# Patient Record
Sex: Female | Born: 1995 | Race: Black or African American | Hispanic: No | Marital: Single | Smoking: Never smoker
Health system: Southern US, Community
[De-identification: ages and names within clinical notes are randomized; demographics above are authoritative.]

## PROBLEM LIST (undated history)

## (undated) DIAGNOSIS — Z789 Other specified health status: Secondary | ICD-10-CM

## (undated) HISTORY — PX: NO PAST SURGERIES: SHX2092

## (undated) HISTORY — DX: Other specified health status: Z78.9

---

## 2015-06-16 ENCOUNTER — Encounter (HOSPITAL_COMMUNITY): Payer: Self-pay | Admitting: *Deleted

## 2015-06-16 ENCOUNTER — Emergency Department (HOSPITAL_COMMUNITY): Payer: No Typology Code available for payment source

## 2015-06-16 ENCOUNTER — Emergency Department (HOSPITAL_COMMUNITY)
Admission: EM | Admit: 2015-06-16 | Discharge: 2015-06-16 | Disposition: A | Discharge status: Home / Self Care | Payer: No Typology Code available for payment source | Attending: Emergency Medicine | Admitting: Emergency Medicine

## 2015-06-16 DIAGNOSIS — Y998 Other external cause status: Secondary | ICD-10-CM | POA: Insufficient documentation

## 2015-06-16 DIAGNOSIS — Y9389 Activity, other specified: Secondary | ICD-10-CM | POA: Insufficient documentation

## 2015-06-16 DIAGNOSIS — S060X0A Concussion without loss of consciousness, initial encounter: Secondary | ICD-10-CM | POA: Diagnosis not present

## 2015-06-16 DIAGNOSIS — Y92410 Unspecified street and highway as the place of occurrence of the external cause: Secondary | ICD-10-CM | POA: Insufficient documentation

## 2015-06-16 DIAGNOSIS — S0990XA Unspecified injury of head, initial encounter: Secondary | ICD-10-CM | POA: Diagnosis present

## 2015-06-16 DIAGNOSIS — Z87891 Personal history of nicotine dependence: Secondary | ICD-10-CM | POA: Diagnosis not present

## 2015-06-16 MED ORDER — ONDANSETRON 4 MG PO TBDP
4.0000 mg | ORAL_TABLET | Freq: Once | ORAL | Status: AC
  Administered 2015-06-16: 4 mg via ORAL
  Filled 2015-06-16: qty 1

## 2015-06-16 MED ORDER — ONDANSETRON HCL 4 MG PO TABS: 4.0000 mg | ORAL_TABLET | Freq: Four times a day (QID) | ORAL | Status: DC
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
## 2015-06-16 NOTE — Discharge Instructions (Signed)
Post-Concussion Syndrome  Post-concussion syndrome describes the symptoms that can occur after a head injury. These symptoms can last from weeks to months.  CAUSES   It is not clear why some head injuries cause post-concussion syndrome. It can occur whether your head injury was mild or severe and whether you were wearing head protection or not.   SIGNS AND SYMPTOMS  · Memory difficulties.  · Dizziness.  · Headaches.  · Double vision or blurry vision.  · Sensitivity to light.  · Hearing difficulties.  · Depression.  · Tiredness.  · Weakness.  · Difficulty with concentration.  · Difficulty sleeping or staying asleep.  · Vomiting.  · Poor balance or instability on your feet.  · Slow reaction time.  · Difficulty learning and remembering things you have heard.  DIAGNOSIS   There is no test to determine whether you have post-concussion syndrome. Your health care provider may order an imaging scan of your brain, such as a CT scan, to check for other problems that may be causing your symptoms (such as a severe injury inside your skull).  TREATMENT   Usually, these problems disappear over time without medical care. Your health care provider may prescribe medicine to help ease your symptoms. It is important to follow up with a neurologist to evaluate your recovery and address any lingering symptoms or issues.  HOME CARE INSTRUCTIONS   · Take medicines only as directed by your health care provider. Do not take aspirin. Aspirin can slow blood clotting.  · Sleep with your head slightly elevated to help with headaches.  · Avoid any situation where there is potential for another head injury. This includes football, hockey, soccer, basketball, martial arts, downhill snow sports, and horseback riding. Your condition will get worse every time you experience a concussion. You should avoid these activities until you are evaluated by the appropriate follow-up health care providers.  · Keep all follow-up visits as directed by your health  care provider. This is important.  SEEK MEDICAL CARE IF:  · You have increased problems paying attention or concentrating.  · You have increased difficulty remembering or learning new information.  · You need more time to complete tasks or assignments than before.  · You have increased irritability or decreased ability to cope with stress.  · You have more symptoms than before.  Seek medical care if you have any of the following symptoms for more than two weeks after your injury:  · Lasting (chronic) headaches.  · Dizziness or balance problems.  · Nausea.  · Vision problems.  · Increased sensitivity to noise or light.  · Depression or mood swings.  · Anxiety or irritability.  · Memory problems.  · Difficulty concentrating or paying attention.  · Sleep problems.  · Feeling tired all the time.  SEEK IMMEDIATE MEDICAL CARE IF:  · You have confusion or unusual drowsiness.  · Others find it difficult to wake you up.  · You have nausea or persistent, forceful vomiting.  · You feel like you are moving when you are not (vertigo). Your eyes may move rapidly back and forth.  · You have convulsions or faint.  · You have severe, persistent headaches that are not relieved by medicine.  · You cannot use your arms or legs normally.  · One of your pupils is larger than the other.  · You have clear or bloody discharge from your nose or ears.  · Your problems are getting worse, not better.  MAKE   SURE YOU:  · Understand these instructions.  · Will watch your condition.  · Will get help right away if you are not doing well or get worse.     This information is not intended to replace advice given to you by your health care provider. Make sure you discuss any questions you have with your health care provider.     Document Released: 02/01/2002 Document Revised: 09/02/2014 Document Reviewed: 11/17/2013  Elsevier Interactive Patient Education ©2016 Elsevier Inc.
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
## 2017-08-26 NOTE — L&D Delivery Note (Signed)
Delivery Note Pt pushed x 1 hour and 50 minutes. FHR mod variability, 15x15 accels occasional variables. At 11:54 AM a viable and healthy female was delivered via Vaginal, Spontaneous (Presentation:LOP, asynclitic). Shoulders delivered easily. APGAR: 6, 9; weight 7 lb 15.5 oz (3615 g).   Placenta status: Spontaneous, Intact.  Cord: 3VC with the following complications: None.  Cord pH: NA.  Baby placed skin-to-skin w/ Mom. Baby had good tone, but decreased respiratory effort. Dried and suctioned, but resp effort still not adequate. Cord clamed and cut. Baby taken to warmer. Baby placed skin-to-skin w/ patient after respirations improved.   Anesthesia: Epidural Episiotomy: None Lacerations: 2nd degree;Perineal. Some difficulty w/ continued bleeding of left side of perineal laceration initially. Suture Repair: 3.0 vicryl rapide Est. Blood Loss (mL): 489  Mom to postpartum.  Baby to Couplet care / Skin to Skin.  Please schedule this patient for Postpartum visit in: 4 weeks with the following provider: Any provider For C/S patients schedule nurse incision check in weeks 2 weeks: no Low risk pregnancy complicated by: Nothing Delivery mode:  SVD Anticipated Birth Control:  Nexplanon vs IUD PP Procedures needed: None  Schedule Integrated BH visit: no  [place] 04/27/2018, 10:44 PM

## 2017-10-02 ENCOUNTER — Encounter: Payer: Self-pay | Admitting: Certified Nurse Midwife

## 2017-10-02 ENCOUNTER — Ambulatory Visit (INDEPENDENT_AMBULATORY_CARE_PROVIDER_SITE_OTHER): Payer: BLUE CROSS/BLUE SHIELD | Admitting: Certified Nurse Midwife

## 2017-10-02 VITALS — BP 109/70 | HR 75 | Wt 142.9 lb

## 2017-10-02 DIAGNOSIS — O99611 Diseases of the digestive system complicating pregnancy, first trimester: Secondary | ICD-10-CM

## 2017-10-02 DIAGNOSIS — Z113 Encounter for screening for infections with a predominantly sexual mode of transmission: Secondary | ICD-10-CM | POA: Diagnosis not present

## 2017-10-02 DIAGNOSIS — Z3401 Encounter for supervision of normal first pregnancy, first trimester: Secondary | ICD-10-CM | POA: Diagnosis not present

## 2017-10-02 DIAGNOSIS — K219 Gastro-esophageal reflux disease without esophagitis: Secondary | ICD-10-CM

## 2017-10-02 DIAGNOSIS — O219 Vomiting of pregnancy, unspecified: Secondary | ICD-10-CM

## 2017-10-02 DIAGNOSIS — Z124 Encounter for screening for malignant neoplasm of cervix: Secondary | ICD-10-CM

## 2017-10-02 DIAGNOSIS — Z3400 Encounter for supervision of normal first pregnancy, unspecified trimester: Secondary | ICD-10-CM | POA: Insufficient documentation

## 2017-10-02 MED ORDER — OMEPRAZOLE 20 MG PO CPDR: 20.0000 mg | DELAYED_RELEASE_CAPSULE | Freq: Two times a day (BID) | ORAL | 5 refills | Status: AC

## 2017-10-02 MED ORDER — DOXYLAMINE-PYRIDOXINE 10-10 MG PO TBEC: DELAYED_RELEASE_TABLET | ORAL | 4 refills | Status: DC

## 2017-10-02 MED ORDER — PRENATE PIXIE 10-0.6-0.4-200 MG PO CAPS: 1.0000 | ORAL_CAPSULE | Freq: Every day | ORAL | 12 refills | Status: AC

## 2017-10-02 MED ORDER — PROMETHAZINE HCL 25 MG PO TABS: 25.0000 mg | ORAL_TABLET | Freq: Four times a day (QID) | ORAL | 1 refills | Status: DC | PRN

## 2017-10-02 NOTE — Progress Notes (Signed)
Subjective:   Tamara Kelley is a 22 y.o. G1P0 at [redacted]w[redacted]d by LMP being seen today for her first obstetrical visit.  Her obstetrical history is significant for THC use. Patient does intend to breast feed. Pregnancy history fully reviewed.  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  Influenza vaciene declined.   Patient reports heartburn, nausea, no bleeding, no contractions, no cramping, no leaking and daily emesis.Marland Kitchen  HISTORY: Obstetric History   G1   P0   T0   P0   A0   L0    SAB0   TAB0   Ectopic0   Multiple0   Live Births0     # Outcome Date GA Lbr Len/2nd Weight Sex Delivery Anes PTL Lv  1 Current               Last pap smear was done unknown.     Past Medical History:  Diagnosis Date  . Known health problems: none    Past Surgical History:  Procedure Laterality Date  . NO PAST SURGERIES     Family History  Problem Relation Age of Onset  . Aneurysm Mother   . Hypertension Mother   . Hypertension Maternal Grandmother   . Aneurysm Paternal Grandmother   . Cancer Paternal Grandfather    Social History   Tobacco Use  . Smoking status: Never Smoker  . Smokeless tobacco: Never Used  Substance Use Topics  . Alcohol use: No  . Drug use: Yes    Types: Marijuana   No Known Allergies No current outpatient medications on file prior to visit.   No current facility-administered medications on file prior to visit.     Review of Systems Pertinent items noted in HPI and remainder of comprehensive ROS otherwise negative.  Exam   Vitals:   10/02/17 1056  BP: 109/70  Pulse: 75  Weight: 142 lb 14.4 oz (64.8 kg)   Fetal Heart Rate (bpm): 172; doppler  Uterus:     Pelvic Exam: Perineum: no hemorrhoids, normal perineum   Vulva: normal external genitalia, no lesions   Vagina:  normal mucosa, normal discharge   Cervix: no lesions and normal, pap smear done.    Adnexa: normal adnexa and no mass, fullness, tenderness   Bony Pelvis: average  System: General: well-developed, well-nourished female in no acute distress   Breast:  normal appearance, no masses or tenderness   Skin: normal coloration and turgor, no rashes   Neurologic: oriented, normal, negative, normal mood   Extremities: normal strength, tone, and muscle mass, ROM of all joints is normal   HEENT PERRLA, extraocular movement intact and sclera clear, anicteric   Mouth/Teeth mucous membranes moist, pharynx normal without lesions and dental hygiene good   Neck supple and no masses   Cardiovascular: regular rate and rhythm   Respiratory:  no respiratory distress, normal breath sounds   Abdomen: soft, non-tender; bowel sounds normal; no masses,  no organomegaly     Assessment:   Pregnancy: G1P0 Patient Active Problem List   Diagnosis Date Noted  . Supervision of normal first pregnancy, antepartum 10/02/2017     Plan:  1. Supervision of normal first pregnancy, antepartum    - Cervicovaginal ancillary only - Culture, OB Urine - Cytology - PAP - Genetic Screening - Hemoglobin A1c - Hemoglobinopathy evaluation - Inheritest Core(CF97,SMA,FraX) - Obstetric Panel, Including HIV - VITAMIN D 25 Hydroxy (Vit-D Deficiency, Fractures) - [place] MFM OB COMP +  14 WK; Future - Prenat-FeAsp-Meth-FA-DHA w/o A (PRENATE PIXIE) 10-0.6-0.4-200 MG CAPS; Take 1 tablet by mouth daily.  Dispense: 30 capsule; Refill: 12  2. Nausea/vomiting in pregnancy    - promethazine (PHENERGAN) 25 MG tablet; Take 1 tablet (25 mg total) by mouth every 6 (six) hours as needed for nausea or vomiting.  Dispense: 30 tablet; Refill: 1 - Doxylamine-Pyridoxine (DICLEGIS) 10-10 MG TBEC; Take 1 tablet with breakfast and lunch.  Take 2 tablets at bedtime.  Dispense: 100 tablet; Refill: 4  3. Gastroesophageal reflux during pregnancy in first trimester, antepartum    - omeprazole (PRILOSEC) 20 MG capsule; Take 1 capsule (20 mg total) by mouth 2 (two) times daily before a meal.  Dispense:  60 capsule; Refill: 5   Initial labs drawn. Continue prenatal vitamins. Genetic Screening discussed, NIPS: ordered. Ultrasound discussed; fetal anatomic survey: ordered. Problem list reviewed and updated. The nature of [facility] with multiple MDs and other Advanced Practice Providers was explained to patient; also emphasized that residents, students are part of our team. Routine obstetric precautions reviewed. Return in about 4 weeks (around 10/30/2017) for ROB.     Tamara Kelley, CNM [facility], [facility]

## 2017-10-02 NOTE — Progress Notes (Signed)
Pt c/o intermittent lumbago

## 2017-10-03 LAB — CERVICOVAGINAL ANCILLARY ONLY
Bacterial vaginitis: POSITIVE — AB
CANDIDA VAGINITIS: NEGATIVE
CHLAMYDIA, DNA PROBE: POSITIVE — AB
NEISSERIA GONORRHEA: NEGATIVE
TRICH (WINDOWPATH): NEGATIVE

## 2017-10-03 LAB — CYTOLOGY - PAP: Diagnosis: NEGATIVE

## 2017-10-03 LAB — VITAMIN D 25 HYDROXY (VIT D DEFICIENCY, FRACTURES): Vit D, 25-Hydroxy: 10.8 ng/mL — ABNORMAL LOW (ref 30.0–100.0)

## 2017-10-04 LAB — URINE CULTURE, OB REFLEX

## 2017-10-04 LAB — CULTURE, OB URINE

## 2017-10-06 LAB — OBSTETRIC PANEL, INCLUDING HIV
Antibody Screen: NEGATIVE
BASOS: 0 %
Basophils Absolute: 0 10*3/uL (ref 0.0–0.2)
EOS (ABSOLUTE): 0.1 10*3/uL (ref 0.0–0.4)
Eos: 3 %
HEMATOCRIT: 32.5 % — AB (ref 34.0–46.6)
HEMOGLOBIN: 10.3 g/dL — AB (ref 11.1–15.9)
HEP B S AG: NEGATIVE
HIV Screen 4th Generation wRfx: NONREACTIVE
IMMATURE GRANS (ABS): 0 10*3/uL (ref 0.0–0.1)
IMMATURE GRANULOCYTES: 0 %
Lymphocytes Absolute: 1.7 10*3/uL (ref 0.7–3.1)
Lymphs: 32 %
MCH: 26.1 pg — ABNORMAL LOW (ref 26.6–33.0)
MCHC: 31.7 g/dL (ref 31.5–35.7)
MCV: 83 fL (ref 79–97)
MONOCYTES: 10 %
MONOS ABS: 0.5 10*3/uL (ref 0.1–0.9)
NEUTROS PCT: 55 %
Neutrophils Absolute: 3 10*3/uL (ref 1.4–7.0)
Platelets: 311 10*3/uL (ref 150–379)
RBC: 3.94 x10E6/uL (ref 3.77–5.28)
RDW: 15.9 % — ABNORMAL HIGH (ref 12.3–15.4)
RPR: NONREACTIVE
RUBELLA: 2.52 {index} (ref >0.99)
Rh Factor: POSITIVE
WBC: 5.4 10*3/uL (ref 3.4–10.8)

## 2017-10-06 LAB — HEMOGLOBIN A1C
Est. average glucose Bld gHb Est-mCnc: 111 mg/dL
HEMOGLOBIN A1C: 5.5 % (ref 4.8–5.6)

## 2017-10-06 LAB — HEMOGLOBINOPATHY EVALUATION
HEMOGLOBIN A2 QUANTITATION: 2.2 % (ref 1.8–3.2)
HGB A: 97.8 % (ref 96.4–98.8)
HGB C: 0 %
HGB S: 0 %
HGB VARIANT: 0 %
Hemoglobin F Quantitation: 0 % (ref 0.0–2.0)

## 2017-10-11 ENCOUNTER — Other Ambulatory Visit: Payer: Self-pay | Admitting: Certified Nurse Midwife

## 2017-10-11 DIAGNOSIS — E559 Vitamin D deficiency, unspecified: Secondary | ICD-10-CM

## 2017-10-11 DIAGNOSIS — Z3400 Encounter for supervision of normal first pregnancy, unspecified trimester: Secondary | ICD-10-CM

## 2017-10-11 DIAGNOSIS — O98811 Other maternal infectious and parasitic diseases complicating pregnancy, first trimester: Secondary | ICD-10-CM

## 2017-10-11 DIAGNOSIS — N760 Acute vaginitis: Principal | ICD-10-CM

## 2017-10-11 DIAGNOSIS — A749 Chlamydial infection, unspecified: Secondary | ICD-10-CM | POA: Insufficient documentation

## 2017-10-11 DIAGNOSIS — B9689 Other specified bacterial agents as the cause of diseases classified elsewhere: Secondary | ICD-10-CM

## 2017-10-11 MED ORDER — VITAMIN D (ERGOCALCIFEROL) 1.25 MG (50000 UNIT) PO CAPS: 50000.0000 [IU] | ORAL_CAPSULE | ORAL | 2 refills | Status: DC

## 2017-10-11 MED ORDER — METRONIDAZOLE 0.75 % VA GEL: 1.0000 | Freq: Two times a day (BID) | VAGINAL | 0 refills | Status: DC

## 2017-10-11 MED ORDER — AZITHROMYCIN 250 MG PO TABS: ORAL_TABLET | ORAL | 0 refills | Status: DC

## 2017-10-13 LAB — INHERITEST CORE(CF97,SMA,FRAX)

## 2017-10-15 ENCOUNTER — Other Ambulatory Visit: Payer: Self-pay | Admitting: Certified Nurse Midwife

## 2017-10-15 DIAGNOSIS — Z3400 Encounter for supervision of normal first pregnancy, unspecified trimester: Secondary | ICD-10-CM

## 2017-10-30 ENCOUNTER — Encounter: Payer: Self-pay | Admitting: Certified Nurse Midwife

## 2017-10-30 ENCOUNTER — Ambulatory Visit (INDEPENDENT_AMBULATORY_CARE_PROVIDER_SITE_OTHER): Payer: BLUE CROSS/BLUE SHIELD | Admitting: Certified Nurse Midwife

## 2017-10-30 VITALS — BP 110/71 | HR 74 | Wt 150.0 lb

## 2017-10-30 DIAGNOSIS — B9689 Other specified bacterial agents as the cause of diseases classified elsewhere: Secondary | ICD-10-CM

## 2017-10-30 DIAGNOSIS — E559 Vitamin D deficiency, unspecified: Secondary | ICD-10-CM

## 2017-10-30 DIAGNOSIS — O219 Vomiting of pregnancy, unspecified: Secondary | ICD-10-CM

## 2017-10-30 DIAGNOSIS — Z3400 Encounter for supervision of normal first pregnancy, unspecified trimester: Secondary | ICD-10-CM

## 2017-10-30 DIAGNOSIS — A749 Chlamydial infection, unspecified: Secondary | ICD-10-CM

## 2017-10-30 DIAGNOSIS — N760 Acute vaginitis: Secondary | ICD-10-CM

## 2017-10-30 DIAGNOSIS — O98811 Other maternal infectious and parasitic diseases complicating pregnancy, first trimester: Secondary | ICD-10-CM

## 2017-10-30 MED ORDER — ONDANSETRON HCL 8 MG PO TABS: 8.0000 mg | ORAL_TABLET | Freq: Three times a day (TID) | ORAL | 2 refills | Status: DC | PRN

## 2017-10-30 MED ORDER — AZITHROMYCIN 250 MG PO TABS: ORAL_TABLET | ORAL | 0 refills | Status: DC

## 2017-10-30 MED ORDER — METRONIDAZOLE 500 MG PO TABS
500.0000 mg | ORAL_TABLET | Freq: Two times a day (BID) | ORAL | 0 refills | Status: DC
Start: 2017-10-30 — End: 2017-11-24

## 2017-10-30 MED ORDER — VITAMIN D (ERGOCALCIFEROL) 1.25 MG (50000 UNIT) PO CAPS: 50000.0000 [IU] | ORAL_CAPSULE | ORAL | 2 refills | Status: DC

## 2017-10-30 NOTE — Progress Notes (Signed)
   PRENATAL VISIT NOTE  Subjective:  Tamara Kelley is a 22 y.o. G1P0 at 10536w4d being seen today for ongoing prenatal care.  She is currently monitored for the following issues for this low-risk pregnancy and has Supervision of normal first pregnancy, antepartum; Chlamydia infection affecting pregnancy in first trimester, antepartum; and Vitamin D deficiency on their problem list.  Patient reports no complaints.  Contractions: Not present. Vag. Bleeding: None.  Movement: Absent. Denies leaking of fluid.   The following portions of the patient's history were reviewed and updated as appropriate: allergies, current medications, past family history, past medical history, past social history, past surgical history and problem list. Problem list updated.  Objective:   Vitals:   10/30/17 0814  BP: 110/71  Pulse: 74  Weight: 150 lb (68 kg)    Fetal Status:     Movement: Absent     General:  Alert, oriented and cooperative. Patient is in no acute distress.  Skin: Skin is warm and dry. No rash noted.   Cardiovascular: Normal heart rate noted  Respiratory: Normal respiratory effort, no problems with respiration noted  Abdomen: Soft, gravid, appropriate for gestational age.  Pain/Pressure: Absent     Pelvic: Cervical exam deferred        Extremities: Normal range of motion.  Edema: None  Mental Status:  Normal mood and affect. Normal behavior. Normal judgment and thought content.   Assessment and Plan:  Pregnancy: G1P0 at 7636w4d  1. Supervision of normal first pregnancy, antepartum      Doing well.  Previous lab results discussed.  Anatomy US scheduled for 11/24/17.  - AFP, Serum, Open Spina Bifida  2. Vitamin D deficiency     Taking weekly vitamin D - Vitamin D, Ergocalciferol, (DRISDOL) 50000 units CAPS capsule; Take 1 capsule (50,000 Units total) by mouth every 7 (seven) days.  Dispense: 30 capsule; Refill: 2  3. Chlamydia infection affecting pregnancy in first trimester, antepartum  TOC next ROB.   - azithromycin (ZITHROMAX) 250 MG tablet; Take 4 tablets all together now.  Dispense: 4 tablet; Refill: 0  4. BV (bacterial vaginosis)      Prefers tablets.  - metroNIDAZOLE (FLAGYL) 500 MG tablet; Take 1 tablet (500 mg total) by mouth 2 (two) times daily.  Dispense: 14 tablet; Refill: 0  5. Nausea/vomiting in pregnancy     Phenergan working.  Did not get diclegis filled.  - ondansetron (ZOFRAN) 8 MG tablet; Take 1 tablet (8 mg total) by mouth every 8 (eight) hours as needed for nausea or vomiting.  Dispense: 40 tablet; Refill: 2  Preterm labor symptoms and general obstetric precautions including but not limited to vaginal bleeding, contractions, leaking of fluid and fetal movement were reviewed in detail with the patient. Please refer to After Visit Summary for other counseling recommendations.  Return in about 4 weeks (around 11/27/2017) for ROB.   Roe Coombsachelle A Janard Culp, CNM

## 2017-10-30 NOTE — Patient Instructions (Addendum)
Second Trimester of Pregnancy The second trimester is from week 13 through week 28, month 4 through 6. This is often the time in pregnancy that you feel your best. Often times, morning sickness has lessened or quit. You may have more energy, and you may get hungry more often. Your unborn baby (fetus) is growing rapidly. At the end of the sixth month, he or she is about 9 inches long and weighs about 1 pounds. You will likely feel the baby move (quickening) between 18 and 20 weeks of pregnancy. Follow these instructions at home:  Avoid all smoking, herbs, and alcohol. Avoid drugs not approved by your doctor.  Do not use any tobacco products, including cigarettes, chewing tobacco, and electronic cigarettes. If you need help quitting, ask your doctor. You may get counseling or other support to help you quit.  Only take medicine as told by your doctor. Some medicines are safe and some are not during pregnancy.  Exercise only as told by your doctor. Stop exercising if you start having cramps.  Eat regular, healthy meals.  Wear a good support bra if your breasts are tender.  Do not use hot tubs, steam rooms, or saunas.  Wear your seat belt when driving.  Avoid raw meat, uncooked cheese, and liter boxes and soil used by cats.  Take your prenatal vitamins.  Take 1500-2000 milligrams of calcium daily starting at the 20th week of pregnancy until you deliver your baby.  Try taking medicine that helps you poop (stool softener) as needed, and if your doctor approves. Eat more fiber by eating fresh fruit, vegetables, and whole grains. Drink enough fluids to keep your pee (urine) clear or pale yellow.  Take warm water baths (sitz baths) to soothe pain or discomfort caused by hemorrhoids. Use hemorrhoid cream if your doctor approves.  If you have puffy, bulging veins (varicose veins), wear support hose. Raise (elevate) your feet for 15 minutes, 3-4 times a day. Limit salt in your diet.  Avoid heavy  lifting, wear low heals, and sit up straight.  Rest with your legs raised if you have leg cramps or low back pain.  Visit your dentist if you have not gone during your pregnancy. Use a soft toothbrush to brush your teeth. Be gentle when you floss.  You can have sex (intercourse) unless your doctor tells you not to.  Go to your doctor visits. Get help if:  You feel dizzy.  You have mild cramps or pressure in your lower belly (abdomen).  You have a nagging pain in your belly area.  You continue to feel sick to your stomach (nauseous), throw up (vomit), or have watery poop (diarrhea).  You have bad smelling fluid coming from your vagina.  You have pain with peeing (urination). Get help right away if:  You have a fever.  You are leaking fluid from your vagina.  You have spotting or bleeding from your vagina.  You have severe belly cramping or pain.  You lose or gain weight rapidly.  You have trouble catching your breath and have chest pain.  You notice sudden or extreme puffiness (swelling) of your face, hands, ankles, feet, or legs.  You have not felt the baby move in over an hour.  You have severe headaches that do not go away with medicine.  You have vision changes. This information is not intended to replace advice given to you by your health care provider. Make sure you discuss any questions you have with your health care   provider. Document Released: 11/06/2009 Document Revised: 01/18/2016 Document Reviewed: 10/13/2012 Elsevier Interactive Patient Education  2017 Elsevier Inc.  

## 2017-10-30 NOTE — Progress Notes (Signed)
Pt has not been made aware of Results Pt was unable to be reached .  Never picked up Rx for +CT Needs to discuss.

## 2017-11-05 ENCOUNTER — Other Ambulatory Visit: Payer: Self-pay | Admitting: Certified Nurse Midwife

## 2017-11-05 DIAGNOSIS — Z3400 Encounter for supervision of normal first pregnancy, unspecified trimester: Secondary | ICD-10-CM

## 2017-11-05 LAB — AFP, SERUM, OPEN SPINA BIFIDA
AFP MoM: 0.64
AFP VALUE AFPOSL: 20.9 ng/mL
Gest. Age on Collection Date: 15.6 weeks
Maternal Age At EDD: 24.1 yr
OSBR RISK 1 IN: 10000
Test Results:: NEGATIVE
WEIGHT: 151 [lb_av]

## 2017-11-18 ENCOUNTER — Encounter (HOSPITAL_COMMUNITY): Payer: Self-pay | Admitting: Certified Nurse Midwife

## 2017-11-24 ENCOUNTER — Encounter: Payer: Self-pay | Admitting: Certified Nurse Midwife

## 2017-11-24 ENCOUNTER — Ambulatory Visit (INDEPENDENT_AMBULATORY_CARE_PROVIDER_SITE_OTHER): Payer: BLUE CROSS/BLUE SHIELD | Admitting: Certified Nurse Midwife

## 2017-11-24 ENCOUNTER — Ambulatory Visit (HOSPITAL_COMMUNITY)
Admission: RE | Admit: 2017-11-24 | Discharge: 2017-11-24 | Disposition: A | Discharge status: Home / Self Care | Payer: BLUE CROSS/BLUE SHIELD | Source: Ambulatory Visit | Attending: Certified Nurse Midwife | Admitting: Certified Nurse Midwife

## 2017-11-24 ENCOUNTER — Other Ambulatory Visit: Payer: Self-pay | Admitting: Certified Nurse Midwife

## 2017-11-24 VITALS — BP 104/69 | HR 90 | Wt 158.1 lb

## 2017-11-24 DIAGNOSIS — Z3400 Encounter for supervision of normal first pregnancy, unspecified trimester: Secondary | ICD-10-CM

## 2017-11-24 DIAGNOSIS — Z3A19 19 weeks gestation of pregnancy: Secondary | ICD-10-CM | POA: Diagnosis not present

## 2017-11-24 DIAGNOSIS — A749 Chlamydial infection, unspecified: Secondary | ICD-10-CM

## 2017-11-24 DIAGNOSIS — E559 Vitamin D deficiency, unspecified: Secondary | ICD-10-CM

## 2017-11-24 DIAGNOSIS — O98812 Other maternal infectious and parasitic diseases complicating pregnancy, second trimester: Secondary | ICD-10-CM

## 2017-11-24 DIAGNOSIS — Z113 Encounter for screening for infections with a predominantly sexual mode of transmission: Secondary | ICD-10-CM | POA: Diagnosis not present

## 2017-11-24 DIAGNOSIS — O98811 Other maternal infectious and parasitic diseases complicating pregnancy, first trimester: Secondary | ICD-10-CM

## 2017-11-24 DIAGNOSIS — Z3689 Encounter for other specified antenatal screening: Secondary | ICD-10-CM | POA: Insufficient documentation

## 2017-11-24 DIAGNOSIS — Z3402 Encounter for supervision of normal first pregnancy, second trimester: Secondary | ICD-10-CM

## 2017-11-24 NOTE — Progress Notes (Signed)
Patient states she has not started feeling fetal movement, denies pain.

## 2017-11-24 NOTE — Progress Notes (Signed)
   PRENATAL VISIT NOTE  Subjective:  Tamara Kelley is a 22 y.o. G1P0 at 4350w1d being seen today for ongoing prenatal care.  She is currently monitored for the following issues for this low-risk pregnancy and has Supervision of normal first pregnancy, antepartum; Chlamydia infection affecting pregnancy in first trimester, antepartum; and Vitamin D deficiency on their problem list.  Patient reports no complaints.  Contractions: Not present. Vag. Bleeding: None.   . Denies leaking of fluid.   The following portions of the patient's history were reviewed and updated as appropriate: allergies, current medications, past family history, past medical history, past social history, past surgical history and problem list. Problem list updated.  Objective:   Vitals:   11/24/17 0916  BP: 104/69  Pulse: 90  Weight: 158 lb 1.6 oz (71.7 kg)    Fetal Status: Fetal Heart Rate (bpm): 151; doppler Fundal Height: 18 cm       General:  Alert, oriented and cooperative. Patient is in no acute distress.  Skin: Skin is warm and dry. No rash noted.   Cardiovascular: Normal heart rate noted  Respiratory: Normal respiratory effort, no problems with respiration noted  Abdomen: Soft, gravid, appropriate for gestational age.  Pain/Pressure: Absent     Pelvic: Cervical exam deferred        Extremities: Normal range of motion.  Edema: None  Mental Status: Normal mood and affect. Normal behavior. Normal judgment and thought content.   Assessment and Plan:  Pregnancy: G1P0 at 7150w1d  1. Supervision of normal first pregnancy, antepartum     Doing well.   2. Chlamydia infection affecting pregnancy in first trimester, antepartum     TOC today, took antibiotics - GC[facility]Chlamydia probe amp [facility]  3. Vitamin D deficiency     Taking weekly vitamin D  Preterm labor symptoms and general obstetric precautions including but not limited to vaginal bleeding, contractions, leaking of fluid and fetal movement  were reviewed in detail with the patient. Please refer to After Visit Summary for other counseling recommendations.  Return in about 1 month (around 12/22/2017) for ROB.  Future Appointments  Date Time Provider Department Center  11/24/2017 11:00 AM [facility] US 3 [facility]    Roe Coombsachelle A Kasey Ewings, CNM

## 2017-11-25 LAB — GC/CHLAMYDIA PROBE AMP (~~LOC~~) NOT AT ARMC
CHLAMYDIA, DNA PROBE: NEGATIVE
Neisseria Gonorrhea: NEGATIVE

## 2017-12-01 ENCOUNTER — Other Ambulatory Visit: Payer: Self-pay | Admitting: Certified Nurse Midwife

## 2017-12-01 DIAGNOSIS — Z3400 Encounter for supervision of normal first pregnancy, unspecified trimester: Secondary | ICD-10-CM

## 2017-12-22 ENCOUNTER — Encounter: Payer: BLUE CROSS/BLUE SHIELD | Admitting: Certified Nurse Midwife

## 2017-12-24 ENCOUNTER — Ambulatory Visit (INDEPENDENT_AMBULATORY_CARE_PROVIDER_SITE_OTHER): Payer: BLUE CROSS/BLUE SHIELD | Admitting: Certified Nurse Midwife

## 2017-12-24 ENCOUNTER — Encounter: Payer: Self-pay | Admitting: Certified Nurse Midwife

## 2017-12-24 VITALS — BP 115/68 | HR 99 | Wt 158.0 lb

## 2017-12-24 DIAGNOSIS — E559 Vitamin D deficiency, unspecified: Secondary | ICD-10-CM

## 2017-12-24 DIAGNOSIS — Z3400 Encounter for supervision of normal first pregnancy, unspecified trimester: Secondary | ICD-10-CM

## 2017-12-24 NOTE — Progress Notes (Signed)
   PRENATAL VISIT NOTE  Subjective:  Tamara Kelley is a 22 y.o. G1P0 at [redacted]w[redacted]d being seen today for ongoing prenatal care.  She is currently monitored for the following issues for this low-risk pregnancy and has Supervision of normal first pregnancy, antepartum; Chlamydia infection affecting pregnancy in first trimester, antepartum; and Vitamin D deficiency on their problem list.  Patient reports no complaints.  Contractions: Not present. Vag. Bleeding: None.  Movement: Present. Denies leaking of fluid.   The following portions of the patient's history were reviewed and updated as appropriate: allergies, current medications, past family history, past medical history, past social history, past surgical history and problem list. Problem list updated.  Objective:   Vitals:   12/24/17 0843  BP: 115/68  Pulse: 99  Weight: 158 lb (71.7 kg)    Fetal Status: Fetal Heart Rate (bpm): 145; doppler Fundal Height: 23 cm Movement: Present     General:  Alert, oriented and cooperative. Patient is in no acute distress.  Skin: Skin is warm and dry. No rash noted.   Cardiovascular: Normal heart rate noted  Respiratory: Normal respiratory effort, no problems with respiration noted  Abdomen: Soft, gravid, appropriate for gestational age.  Pain/Pressure: Present     Pelvic: Cervical exam deferred        Extremities: Normal range of motion.     Mental Status: Normal mood and affect. Normal behavior. Normal judgment and thought content.   Assessment and Plan:  Pregnancy: G1P0 at [redacted]w[redacted]d  1. Supervision of normal first pregnancy, antepartum     Doing well.  Has f/u US scheduled.   2. Vitamin D deficiency     Taking weekly vitamin D.   Preterm labor symptoms and general obstetric precautions including but not limited to vaginal bleeding, contractions, leaking of fluid and fetal movement were reviewed in detail with the patient. Please refer to After Visit Summary for other counseling recommendations.    Return in about 1 month (around 01/21/2018) for ROB, 2 hr OGTT.  Future Appointments  Date Time Provider Department Center  12/31/2017  3:15 PM [facility] [place] 4 [facility]    Roe Coombs, CNM

## 2017-12-31 ENCOUNTER — Other Ambulatory Visit: Payer: Self-pay | Admitting: Certified Nurse Midwife

## 2017-12-31 ENCOUNTER — Ambulatory Visit (HOSPITAL_COMMUNITY)
Admission: RE | Admit: 2017-12-31 | Discharge: 2017-12-31 | Disposition: A | Discharge status: Home / Self Care | Payer: BLUE CROSS/BLUE SHIELD | Source: Ambulatory Visit | Attending: Certified Nurse Midwife | Admitting: Certified Nurse Midwife

## 2017-12-31 DIAGNOSIS — Z0489 Encounter for examination and observation for other specified reasons: Secondary | ICD-10-CM

## 2017-12-31 DIAGNOSIS — Z3A24 24 weeks gestation of pregnancy: Secondary | ICD-10-CM | POA: Insufficient documentation

## 2017-12-31 DIAGNOSIS — Z3400 Encounter for supervision of normal first pregnancy, unspecified trimester: Secondary | ICD-10-CM

## 2017-12-31 DIAGNOSIS — IMO0002 Reserved for concepts with insufficient information to code with codable children: Secondary | ICD-10-CM

## 2017-12-31 DIAGNOSIS — Z3402 Encounter for supervision of normal first pregnancy, second trimester: Secondary | ICD-10-CM | POA: Insufficient documentation

## 2018-01-05 ENCOUNTER — Other Ambulatory Visit: Payer: Self-pay | Admitting: Certified Nurse Midwife

## 2018-01-05 DIAGNOSIS — Z3400 Encounter for supervision of normal first pregnancy, unspecified trimester: Secondary | ICD-10-CM

## 2018-01-21 ENCOUNTER — Ambulatory Visit (INDEPENDENT_AMBULATORY_CARE_PROVIDER_SITE_OTHER): Payer: BLUE CROSS/BLUE SHIELD | Admitting: Certified Nurse Midwife

## 2018-01-21 ENCOUNTER — Encounter: Payer: Self-pay | Admitting: Certified Nurse Midwife

## 2018-01-21 ENCOUNTER — Other Ambulatory Visit: Payer: BLUE CROSS/BLUE SHIELD

## 2018-01-21 VITALS — BP 111/68 | HR 85 | Wt 162.9 lb

## 2018-01-21 DIAGNOSIS — E559 Vitamin D deficiency, unspecified: Secondary | ICD-10-CM

## 2018-01-21 DIAGNOSIS — Z3400 Encounter for supervision of normal first pregnancy, unspecified trimester: Secondary | ICD-10-CM

## 2018-01-21 DIAGNOSIS — Z3482 Encounter for supervision of other normal pregnancy, second trimester: Secondary | ICD-10-CM

## 2018-01-21 NOTE — Patient Instructions (Signed)
Third Trimester of Pregnancy The third trimester is from week 29 through week 42, months 7 through 9. This trimester is when your unborn baby (fetus) is growing very fast. At the end of the ninth month, the unborn baby is about 20 inches in length. It weighs about 6-10 pounds. Follow these instructions at home:  Avoid all smoking, herbs, and alcohol. Avoid drugs not approved by your doctor.  Do not use any tobacco products, including cigarettes, chewing tobacco, and electronic cigarettes. If you need help quitting, ask your doctor. You may get counseling or other support to help you quit.  Only take medicine as told by your doctor. Some medicines are safe and some are not during pregnancy.  Exercise only as told by your doctor. Stop exercising if you start having cramps.  Eat regular, healthy meals.  Wear a good support bra if your breasts are tender.  Do not use hot tubs, steam rooms, or saunas.  Wear your seat belt when driving.  Avoid raw meat, uncooked cheese, and liter boxes and soil used by cats.  Take your prenatal vitamins.  Take 1500-2000 milligrams of calcium daily starting at the 20th week of pregnancy until you deliver your baby.  Try taking medicine that helps you poop (stool softener) as needed, and if your doctor approves. Eat more fiber by eating fresh fruit, vegetables, and whole grains. Drink enough fluids to keep your pee (urine) clear or pale yellow.  Take warm water baths (sitz baths) to soothe pain or discomfort caused by hemorrhoids. Use hemorrhoid cream if your doctor approves.  If you have puffy, bulging veins (varicose veins), wear support hose. Raise (elevate) your feet for 15 minutes, 3-4 times a day. Limit salt in your diet.  Avoid heavy lifting, wear low heels, and sit up straight.  Rest with your legs raised if you have leg cramps or low back pain.  Visit your dentist if you have not gone during your pregnancy. Use a soft toothbrush to brush your  teeth. Be gentle when you floss.  You can have sex (intercourse) unless your doctor tells you not to.  Do not travel far distances unless you must. Only do so with your doctor's approval.  Take prenatal classes.  Practice driving to the hospital.  Pack your hospital bag.  Prepare the baby's room.  Go to your doctor visits. Get help if:  You are not sure if you are in labor or if your water has broken.  You are dizzy.  You have mild cramps or pressure in your lower belly (abdominal).  You have a nagging pain in your belly area.  You continue to feel sick to your stomach (nauseous), throw up (vomit), or have watery poop (diarrhea).  You have bad smelling fluid coming from your vagina.  You have pain with peeing (urination). Get help right away if:  You have a fever.  You are leaking fluid from your vagina.  You are spotting or bleeding from your vagina.  You have severe belly cramping or pain.  You lose or gain weight rapidly.  You have trouble catching your breath and have chest pain.  You notice sudden or extreme puffiness (swelling) of your face, hands, ankles, feet, or legs.  You have not felt the baby move in over an hour.  You have severe headaches that do not go away with medicine.  You have vision changes. This information is not intended to replace advice given to you by your health care provider. Make   sure you discuss any questions you have with your health care provider. Document Released: 11/06/2009 Document Revised: 01/18/2016 Document Reviewed: 10/13/2012 Elsevier Interactive Patient Education  2017 [employer].  Preterm Labor and Birth Information The normal length of a pregnancy is 39-41 weeks. Preterm labor is when labor starts before 37 completed weeks of pregnancy. What are the risk factors for preterm labor? Preterm labor is more likely to occur in women who:  Have certain infections during pregnancy such as a bladder infection, sexually  transmitted infection, or infection inside the uterus (chorioamnionitis).  Have a shorter-than-normal cervix.  Have gone into preterm labor before.  Have had surgery on their cervix.  Are younger than age 39 or older than age 38.  Are African American.  Are pregnant with twins or multiple babies (multiple gestation).  Take street drugs or smoke while pregnant.  Do not gain enough weight while pregnant.  Became pregnant shortly after having been pregnant.  What are the symptoms of preterm labor? Symptoms of preterm labor include:  Cramps similar to those that can happen during a menstrual period. The cramps may happen with diarrhea.  Pain in the abdomen or lower back.  Regular uterine contractions that may feel like tightening of the abdomen.  A feeling of increased pressure in the pelvis.  Increased watery or bloody mucus discharge from the vagina.  Water breaking (ruptured amniotic sac).  Why is it important to recognize signs of preterm labor? It is important to recognize signs of preterm labor because babies who are born prematurely may not be fully developed. This can put them at an increased risk for:  Long-term (chronic) heart and lung problems.  Difficulty immediately after birth with regulating body systems, including blood sugar, body temperature, heart rate, and breathing rate.  Bleeding in the brain.  Cerebral palsy.  Learning difficulties.  Death.  These risks are highest for babies who are born before 34 weeks of pregnancy. How is preterm labor treated? Treatment depends on the length of your pregnancy, your condition, and the health of your baby. It may involve:  Having a stitch (suture) placed in your cervix to prevent your cervix from opening too early (cerclage).  Taking or being given medicines, such as: ? Hormone medicines. These may be given early in pregnancy to help support the pregnancy. ? Medicine to stop contractions. ? Medicines to  help mature the baby's lungs. These may be prescribed if the risk of delivery is high. ? Medicines to prevent your baby from developing cerebral palsy.  If the labor happens before 34 weeks of pregnancy, you may need to stay in the hospital. What should I do if I think I am in preterm labor? If you think that you are going into preterm labor, call your health care provider right away. How can I prevent preterm labor in future pregnancies? To increase your chance of having a full-term pregnancy:  Do not use any tobacco products, such as cigarettes, chewing tobacco, and e-cigarettes. If you need help quitting, ask your health care provider.  Do not use street drugs or medicines that have not been prescribed to you during your pregnancy.  Talk with your health care provider before taking any herbal supplements, even if you have been taking them regularly.  Make sure you gain a healthy amount of weight during your pregnancy.  Watch for infection. If you think that you might have an infection, get it checked right away.  Make sure to tell your health care provider  if you have gone into preterm labor before.  This information is not intended to replace advice given to you by your health care provider. Make sure you discuss any questions you have with your health care provider. Document Released: 11/02/2003 Document Revised: 01/23/2016 Document Reviewed: 01/03/2016 Elsevier Interactive Patient Education  2018 [employer]  [employer] of the uterus can occur throughout pregnancy, but they are not always a sign that you are in labor. You may have practice contractions called Braxton Hicks contractions. These false labor contractions are sometimes confused with true labor. What are Deberah Pelton contractions? Braxton Hicks contractions are tightening movements that occur in the muscles of the uterus before labor. Unlike true labor contractions, these contractions do  not result in opening (dilation) and thinning of the cervix. Toward the end of pregnancy (32-34 weeks), Braxton Hicks contractions can happen more often and may become stronger. These contractions are sometimes difficult to tell apart from true labor because they can be very uncomfortable. You should not feel embarrassed if you go to the hospital with false labor. Sometimes, the only way to tell if you are in true labor is for your health care provider to look for changes in the cervix. The health care provider will do a physical exam and may monitor your contractions. If you are not in true labor, the exam should show that your cervix is not dilating and your water has not broken. If there are other health problems associated with your pregnancy, it is completely safe for you to be sent home with false labor. You may continue to have Braxton Hicks contractions until you go into true labor. How to tell the difference between true labor and false labor True labor  Contractions last 30-70 seconds.  Contractions become very regular.  Discomfort is usually felt in the top of the uterus, and it spreads to the lower abdomen and low back.  Contractions do not go away with walking.  Contractions usually become more intense and increase in frequency.  The cervix dilates and gets thinner. False labor  Contractions are usually shorter and not as strong as true labor contractions.  Contractions are usually irregular.  Contractions are often felt in the front of the lower abdomen and in the groin.  Contractions may go away when you walk around or change positions while lying down.  Contractions get weaker and are shorter-lasting as time goes on.  The cervix usually does not dilate or become thin. Follow these instructions at home:  Take over-the-counter and prescription medicines only as told by your health care provider.  Keep up with your usual exercises and follow other instructions from your  health care provider.  Eat and drink lightly if you think you are going into labor.  If Braxton Hicks contractions are making you uncomfortable: ? Change your position from lying down or resting to walking, or change from walking to resting. ? Sit and rest in a tub of warm water. ? Drink enough fluid to keep your urine pale yellow. Dehydration may cause these contractions. ? Do slow and deep breathing several times an hour.  Keep all follow-up prenatal visits as told by your health care provider. This is important. Contact a health care provider if:  You have a fever.  You have continuous pain in your abdomen. Get help right away if:  Your contractions become stronger, more regular, and closer together.  You have fluid leaking or gushing from your vagina.  You pass blood-tinged  mucus (bloody show).  You have bleeding from your vagina.  You have low back pain that you never had before.  You feel your baby's head pushing down and causing pelvic pressure.  Your baby is not moving inside you as much as it used to. Summary  Contractions that occur before labor are called Braxton Hicks contractions, false labor, or practice contractions.  Braxton Hicks contractions are usually shorter, weaker, farther apart, and less regular than true labor contractions. True labor contractions usually become progressively stronger and regular and they become more frequent.  Manage discomfort from [facility] contractions by changing position, resting in a warm bath, drinking plenty of water, or practicing deep breathing. This information is not intended to replace advice given to you by your health care provider. Make sure you discuss any questions you have with your health care provider. Document Released: 12/26/2016 Document Revised: 12/26/2016 Document Reviewed: 12/26/2016 Elsevier Interactive Patient Education  2018 [employer].

## 2018-01-21 NOTE — Progress Notes (Signed)
   PRENATAL VISIT NOTE  Subjective:  Tamara Kelley is a 22 y.o. G1P0 at [redacted]w[redacted]d being seen today for ongoing prenatal care.  She is currently monitored for the following issues for this low-risk pregnancy and has Supervision of normal first pregnancy, antepartum; Chlamydia infection affecting pregnancy in first trimester, antepartum; and Vitamin D deficiency on their problem list.  Patient reports no complaints.  Contractions: Not present. Vag. Bleeding: None.  Movement: Present. Denies leaking of fluid.   The following portions of the patient's history were reviewed and updated as appropriate: allergies, current medications, past family history, past medical history, past social history, past surgical history and problem list. Problem list updated.  Objective:   Vitals:   01/21/18 0814  BP: 111/68  Pulse: 85  Weight: 162 lb 14.4 oz (73.9 kg)    Fetal Status: Fetal Heart Rate (bpm): 142; doppler Fundal Height: 27 cm Movement: Present     General:  Alert, oriented and cooperative. Patient is in no acute distress.  Skin: Skin is warm and dry. No rash noted.   Cardiovascular: Normal heart rate noted  Respiratory: Normal respiratory effort, no problems with respiration noted  Abdomen: Soft, gravid, appropriate for gestational age.  Pain/Pressure: Absent     Pelvic: Cervical exam deferred        Extremities: Normal range of motion.  Edema: Trace  Mental Status: Normal mood and affect. Normal behavior. Normal judgment and thought content.   Assessment and Plan:  Pregnancy: G1P0 at [redacted]w[redacted]d  1. Encounter for supervision of other normal pregnancy, second trimester     Doing well.  Breast pump information given.  F/U US scheduled for anatomy.  - Glucose Tolerance, 2 Hours w/1 Hour - CBC - RPR - HIV antibody  2. Supervision of normal first pregnancy, antepartum       3. Vitamin D deficiency     Taking weekly vitamin D  Preterm labor symptoms and general obstetric precautions including  but not limited to vaginal bleeding, contractions, leaking of fluid and fetal movement were reviewed in detail with the patient. Please refer to After Visit Summary for other counseling recommendations.  Return in about 2 weeks (around 02/04/2018) for ROB.  Future Appointments  Date Time Provider Department Center  02/05/2018  3:15 PM [facility] [place] 4 [facility]    Roe Coombs, CNM

## 2018-01-22 ENCOUNTER — Other Ambulatory Visit: Payer: Self-pay | Admitting: Certified Nurse Midwife

## 2018-01-22 DIAGNOSIS — Z3400 Encounter for supervision of normal first pregnancy, unspecified trimester: Secondary | ICD-10-CM

## 2018-01-22 DIAGNOSIS — O99019 Anemia complicating pregnancy, unspecified trimester: Secondary | ICD-10-CM | POA: Insufficient documentation

## 2018-01-22 DIAGNOSIS — O99012 Anemia complicating pregnancy, second trimester: Secondary | ICD-10-CM

## 2018-01-22 LAB — CBC
Hematocrit: 26.9 % — ABNORMAL LOW (ref 34.0–46.6)
Hemoglobin: 8.5 g/dL — ABNORMAL LOW (ref 11.1–15.9)
MCH: 25.9 pg — ABNORMAL LOW (ref 26.6–33.0)
MCHC: 31.6 g/dL (ref 31.5–35.7)
MCV: 82 fL (ref 79–97)
Platelets: 232 10*3/uL (ref 150–450)
RBC: 3.28 x10E6/uL — AB (ref 3.77–5.28)
RDW: 15.1 % (ref 12.3–15.4)
WBC: 6.6 10*3/uL (ref 3.4–10.8)

## 2018-01-22 LAB — GLUCOSE TOLERANCE, 2 HOURS W/ 1HR
GLUCOSE, 2 HOUR: 104 mg/dL (ref 65–152)
Glucose, 1 hour: 129 mg/dL (ref 65–179)
Glucose, Fasting: 78 mg/dL (ref 65–91)

## 2018-01-22 LAB — RPR: RPR: NONREACTIVE

## 2018-01-22 LAB — HIV ANTIBODY (ROUTINE TESTING W REFLEX): HIV SCREEN 4TH GENERATION: NONREACTIVE

## 2018-01-22 MED ORDER — FUSION PLUS PO CAPS: 1.0000 | ORAL_CAPSULE | Freq: Every day | ORAL | 5 refills | Status: DC

## 2018-01-26 LAB — FERRITIN: Ferritin: 8 ng/mL — ABNORMAL LOW (ref 15–150)

## 2018-01-26 LAB — SPECIMEN STATUS REPORT

## 2018-02-05 ENCOUNTER — Ambulatory Visit (INDEPENDENT_AMBULATORY_CARE_PROVIDER_SITE_OTHER): Payer: BLUE CROSS/BLUE SHIELD | Admitting: Certified Nurse Midwife

## 2018-02-05 ENCOUNTER — Encounter: Payer: Self-pay | Admitting: Certified Nurse Midwife

## 2018-02-05 ENCOUNTER — Ambulatory Visit (HOSPITAL_COMMUNITY)
Admission: RE | Admit: 2018-02-05 | Discharge: 2018-02-05 | Disposition: A | Discharge status: Home / Self Care | Payer: BLUE CROSS/BLUE SHIELD | Source: Ambulatory Visit | Attending: Certified Nurse Midwife | Admitting: Certified Nurse Midwife

## 2018-02-05 VITALS — BP 109/64 | HR 80 | Wt 165.2 lb

## 2018-02-05 DIAGNOSIS — Z3400 Encounter for supervision of normal first pregnancy, unspecified trimester: Secondary | ICD-10-CM

## 2018-02-05 DIAGNOSIS — O26893 Other specified pregnancy related conditions, third trimester: Secondary | ICD-10-CM | POA: Diagnosis not present

## 2018-02-05 DIAGNOSIS — B373 Candidiasis of vulva and vagina: Secondary | ICD-10-CM

## 2018-02-05 DIAGNOSIS — Z3A29 29 weeks gestation of pregnancy: Secondary | ICD-10-CM | POA: Diagnosis not present

## 2018-02-05 DIAGNOSIS — N898 Other specified noninflammatory disorders of vagina: Secondary | ICD-10-CM

## 2018-02-05 DIAGNOSIS — Z3403 Encounter for supervision of normal first pregnancy, third trimester: Secondary | ICD-10-CM | POA: Diagnosis not present

## 2018-02-05 DIAGNOSIS — E559 Vitamin D deficiency, unspecified: Secondary | ICD-10-CM

## 2018-02-05 DIAGNOSIS — O99013 Anemia complicating pregnancy, third trimester: Secondary | ICD-10-CM

## 2018-02-05 DIAGNOSIS — B3731 Acute candidiasis of vulva and vagina: Secondary | ICD-10-CM

## 2018-02-05 MED ORDER — FLUCONAZOLE 150 MG PO TABS: 150.0000 mg | ORAL_TABLET | Freq: Once | ORAL | 0 refills | Status: AC

## 2018-02-05 NOTE — Progress Notes (Signed)
Patient reports good fetal movement, denies pain. 

## 2018-02-05 NOTE — Progress Notes (Signed)
   PRENATAL VISIT NOTE  Subjective:  Tamara Kelley is a 22 y.o. G1P0Darrick Meigs at 1644w4d being seen today for ongoing prenatal care.  She is currently monitored for the following issues for this low-risk pregnancy and has Supervision of normal first pregnancy, antepartum; Chlamydia infection affecting pregnancy in first trimester, antepartum; Vitamin D deficiency; and Anemia in pregnancy on their problem list.  Patient reports no bleeding, no contractions, no cramping, no leaking, vaginal irritation and denies LOF, bleeding, states occasional itching.  Contractions: Not present. Vag. Bleeding: None.  Movement: Present. Denies leaking of fluid.   The following portions of the patient's history were reviewed and updated as appropriate: allergies, current medications, past family history, past medical history, past social history, past surgical history and problem list. Problem list updated.  Objective:   Vitals:   02/05/18 1413  BP: 109/64  Pulse: 80  Weight: 165 lb 3.2 oz (74.9 kg)    Fetal Status: Fetal Heart Rate (bpm): 147; doppler Fundal Height: 29 cm Movement: Present     General:  Alert, oriented and cooperative. Patient is in no acute distress.  Skin: Skin is warm and dry. No rash noted.   Cardiovascular: Normal heart rate noted  Respiratory: Normal respiratory effort, no problems with respiration noted  Abdomen: Soft, gravid, appropriate for gestational age.  Pain/Pressure: Absent     Pelvic: Cervical exam deferred        Extremities: Normal range of motion.  Edema: None  Mental Status: Normal mood and affect. Normal behavior. Normal judgment and thought content.   Assessment and Plan:  Pregnancy: G1P0 at 3844w4d  1. Supervision of normal first pregnancy, antepartum     Has f/u anatomy US scheduled for today  2. Vitamin D deficiency     Taking weekly vitamin D  3. Anemia during pregnancy in third trimester     Taking Fusion, OTC colace discussed  4. Yeast vaginitis      -  fluconazole (DIFLUCAN) 150 MG tablet; Take 1 tablet (150 mg total) by mouth once for 1 dose.  Dispense: 1 tablet; Refill: 0  5. Vaginal discharge during pregnancy in third trimester       - Urine cytology ancillary only  Preterm labor symptoms and general obstetric precautions including but not limited to vaginal bleeding, contractions, leaking of fluid and fetal movement were reviewed in detail with the patient. Please refer to After Visit Summary for other counseling recommendations.  Return in about 2 weeks (around 02/19/2018) for ROB.  Future Appointments  Date Time Provider Department Center  02/05/2018  3:15 PM [facility] US 4 [facility]  02/16/2018  1:45 PM Auden Wettstein, Rodell Pernaachelle A, CNM [facility] None    Roe Coombsachelle A Rajni Holsworth, CNM

## 2018-02-09 LAB — URINE CYTOLOGY ANCILLARY ONLY
BACTERIAL VAGINITIS: POSITIVE — AB
Candida vaginitis: POSITIVE — AB

## 2018-02-10 ENCOUNTER — Other Ambulatory Visit: Payer: Self-pay | Admitting: Certified Nurse Midwife

## 2018-02-10 DIAGNOSIS — Z3400 Encounter for supervision of normal first pregnancy, unspecified trimester: Secondary | ICD-10-CM

## 2018-02-10 DIAGNOSIS — B9689 Other specified bacterial agents as the cause of diseases classified elsewhere: Secondary | ICD-10-CM

## 2018-02-10 DIAGNOSIS — B373 Candidiasis of vulva and vagina: Secondary | ICD-10-CM

## 2018-02-10 DIAGNOSIS — N760 Acute vaginitis: Secondary | ICD-10-CM

## 2018-02-10 DIAGNOSIS — B3731 Acute candidiasis of vulva and vagina: Secondary | ICD-10-CM

## 2018-02-10 MED ORDER — FLUCONAZOLE 150 MG PO TABS: 150.0000 mg | ORAL_TABLET | Freq: Once | ORAL | 0 refills | Status: AC

## 2018-02-10 MED ORDER — TERCONAZOLE 0.8 % VA CREA
1.0000 | TOPICAL_CREAM | Freq: Every day | VAGINAL | 0 refills | Status: DC
Start: 2018-02-10 — End: 2018-02-16

## 2018-02-10 MED ORDER — SECNIDAZOLE 2 G PO PACK: 1.0000 | PACK | Freq: Once | ORAL | 0 refills | Status: AC

## 2018-02-16 ENCOUNTER — Encounter: Payer: Self-pay | Admitting: Certified Nurse Midwife

## 2018-02-16 ENCOUNTER — Ambulatory Visit (INDEPENDENT_AMBULATORY_CARE_PROVIDER_SITE_OTHER): Payer: BLUE CROSS/BLUE SHIELD | Admitting: Certified Nurse Midwife

## 2018-02-16 VITALS — BP 112/67 | HR 84 | Wt 166.6 lb

## 2018-02-16 DIAGNOSIS — E559 Vitamin D deficiency, unspecified: Secondary | ICD-10-CM

## 2018-02-16 DIAGNOSIS — D649 Anemia, unspecified: Secondary | ICD-10-CM

## 2018-02-16 DIAGNOSIS — O99013 Anemia complicating pregnancy, third trimester: Secondary | ICD-10-CM

## 2018-02-16 DIAGNOSIS — Z3400 Encounter for supervision of normal first pregnancy, unspecified trimester: Secondary | ICD-10-CM

## 2018-02-16 NOTE — Progress Notes (Signed)
   PRENATAL VISIT NOTE  Subjective:  Tamara Kelley is a 22 y.o. G1P0 at 3617w1d being seen today for ongoing prenatal care.  She is currently monitored for the following issues for this low-risk pregnancy and has Supervision of normal first pregnancy, antepartum; Chlamydia infection affecting pregnancy in first trimester, antepartum; Vitamin D deficiency; and Anemia in pregnancy on their problem list.  Patient reports no bleeding, no contractions, no cramping, no leaking and fatigue.  Contractions: Not present. Vag. Bleeding: None.  Movement: Present. Denies leaking of fluid.   The following portions of the patient's history were reviewed and updated as appropriate: allergies, current medications, past family history, past medical history, past social history, past surgical history and problem list. Problem list updated.  Objective:   Vitals:   02/16/18 1410  BP: 112/67  Pulse: 84  Weight: 166 lb 9.6 oz (75.6 kg)    Fetal Status: Fetal Heart Rate (bpm): 152; doppler Fundal Height: 30 cm Movement: Present     General:  Alert, oriented and cooperative. Patient is in no acute distress.  Skin: Skin is warm and dry. No rash noted.   Cardiovascular: Normal heart rate noted  Respiratory: Normal respiratory effort, no problems with respiration noted  Abdomen: Soft, gravid, appropriate for gestational age.  Pain/Pressure: Absent     Pelvic: Cervical exam deferred        Extremities: Normal range of motion.  Edema: None  Mental Status: Normal mood and affect. Normal behavior. Normal judgment and thought content.   Assessment and Plan:  Pregnancy: G1P0 at 5617w1d  1. Supervision of normal first pregnancy, antepartum        2. Vitamin D deficiency     Taking weekly vitamin D  3. Anemia during pregnancy in third trimester     Feeling better with PO iron.  Hgb: 8.5, ferratin 8.  Iron IV transfusion prescribed.   Preterm labor symptoms and general obstetric precautions including but not  limited to vaginal bleeding, contractions, leaking of fluid and fetal movement were reviewed in detail with the patient. Please refer to After Visit Summary for other counseling recommendations.  Return in about 2 weeks (around 03/02/2018) for ROB.  No future appointments.  Roe Coombsachelle A Arkin Imran, CNM

## 2018-02-19 ENCOUNTER — Other Ambulatory Visit (HOSPITAL_COMMUNITY): Payer: Self-pay

## 2018-02-20 ENCOUNTER — Ambulatory Visit (HOSPITAL_COMMUNITY)
Admission: RE | Admit: 2018-02-20 | Discharge: 2018-02-20 | Disposition: A | Discharge status: Home / Self Care | Payer: BLUE CROSS/BLUE SHIELD | Source: Ambulatory Visit | Attending: Obstetrics | Admitting: Obstetrics

## 2018-02-20 DIAGNOSIS — R790 Abnormal level of blood mineral: Secondary | ICD-10-CM | POA: Insufficient documentation

## 2018-02-20 DIAGNOSIS — O99019 Anemia complicating pregnancy, unspecified trimester: Secondary | ICD-10-CM | POA: Diagnosis not present

## 2018-02-20 MED ORDER — SODIUM CHLORIDE 0.9 % IV SOLN
510.0000 mg | INTRAVENOUS | Status: DC
  Administered 2018-02-20: 510 mg via INTRAVENOUS
  Filled 2018-02-20: qty 17

## 2018-02-20 NOTE — Discharge Instructions (Signed)

## 2018-02-24 ENCOUNTER — Encounter: Payer: BLUE CROSS/BLUE SHIELD | Admitting: Certified Nurse Midwife

## 2018-02-27 ENCOUNTER — Ambulatory Visit (INDEPENDENT_AMBULATORY_CARE_PROVIDER_SITE_OTHER): Payer: BLUE CROSS/BLUE SHIELD | Admitting: Certified Nurse Midwife

## 2018-02-27 ENCOUNTER — Encounter: Payer: Self-pay | Admitting: Certified Nurse Midwife

## 2018-02-27 VITALS — BP 107/70 | HR 82 | Wt 167.0 lb

## 2018-02-27 DIAGNOSIS — D649 Anemia, unspecified: Secondary | ICD-10-CM

## 2018-02-27 DIAGNOSIS — Z3403 Encounter for supervision of normal first pregnancy, third trimester: Secondary | ICD-10-CM

## 2018-02-27 DIAGNOSIS — E559 Vitamin D deficiency, unspecified: Secondary | ICD-10-CM

## 2018-02-27 DIAGNOSIS — O99013 Anemia complicating pregnancy, third trimester: Secondary | ICD-10-CM

## 2018-02-27 DIAGNOSIS — Z3400 Encounter for supervision of normal first pregnancy, unspecified trimester: Secondary | ICD-10-CM

## 2018-02-27 NOTE — Patient Instructions (Signed)
AREA PEDIATRIC/FAMILY PRACTICE PHYSICIANS  [facility] [address], [address] [place], [place]  [address] Phone - [phone]   Fax - [phone]  [facility] [address] [place], [place] [address] Phone - [phone]   Fax - [phone]  [facility] AM[facility] [address] [place], [place]  [address] Phone - [phone]   Fax - [phone]  [facility] [address] [place], [place]  [address] Phone - [phone]   Fax - [phone]  [facility] [address] [place], [place]  [address] Phone - [phone]   Fax - [phone]  [facility] [address] [place], [place]  [address] Phone - [phone]   Fax - [phone]  [facility] [address] [place], [place]  [address] Phone - [phone]   Fax - [phone]  [facility] [address], [address] [place], [place]  [address] Phone - [phone]   Fax - [phone]  [facility] [address] [place], [place]  [address] Phone - [phone]   Fax - [phone] [facility] [address] [place], [place]  [address] Phone - [phone]   Fax - [phone]  [facility] [address] [place], [place]  [address] Phone - [phone]   Fax - [phone]  [facility] [address] [place], [place]  [address] Phone - [phone]   Fax - [phone]  [facility] [address], [address] [place], [place]  [address] Phone - [phone]   Fax - [phone]  SHILPA GOSRANI [address] [place], [place]  [address] Phone - [phone]  [facility] [address] [place], [place]  [address] Phone - [phone]   Fax - [phone]  [facility] [address] [place], [place]  [address] Phone - [phone]   Fax - [phone]  [facility] [address] [place], [place]  [address] Phone - [phone]   Fax - [phone]  [facility] [address] [place], [place]  [address] Phone - [phone]   Fax - [phone]   [facility] [address] [place], [place]  [address] Phone - [phone]   Fax - [phone]  [facility] [address] [place], [place]  [address] Phone - [phone]   Fax - [phone]  DAVID RUBIN [address] [place], [place]  [address] Phone - [phone]   Fax - [phone]  [facility] [address] [place], [place]  [address] Phone - [phone]   Fax - [phone]  [facility] [address] , [place]  [address] Phone - [phone]   Fax - [phone] [facility] [address] [place], [place]  [address] Phone - [phone]   Fax - [phone]  [facility] [address] [place], [place]  [address] Phone - [phone]   Fax - [phone]  [facility] [address] [place],   [address] Phone - [phone]   Fax - [phone]  [facility] Charlene Flemming MD [address] [place]  [address] Phone [phone]  Fax [phone]   

## 2018-02-27 NOTE — Progress Notes (Signed)
   PRENATAL VISIT NOTE  Subjective:  Tamara Kelley is a 22 y.o. G1P0 at 2720w5d being seen today for ongoing prenatal care.  She is currently monitored for the following issues for this low-risk pregnancy and has Supervision of normal first pregnancy, antepartum; Chlamydia infection affecting pregnancy in first trimester, antepartum; Vitamin D deficiency; and Anemia in pregnancy on their problem list.  Patient reports no complaints.  Contractions: Not present. Vag. Bleeding: None.  Movement: Present. Denies leaking of fluid.   The following portions of the patient's history were reviewed and updated as appropriate: allergies, current medications, past family history, past medical history, past social history, past surgical history and problem list. Problem list updated.  Objective:   Vitals:   02/27/18 0819  BP: 107/70  Pulse: 82  Weight: 167 lb (75.8 kg)    Fetal Status: Fetal Heart Rate (bpm): 144; doppler Fundal Height: 32 cm Movement: Present     General:  Alert, oriented and cooperative. Patient is in no acute distress.  Skin: Skin is warm and dry. No rash noted.   Cardiovascular: Normal heart rate noted  Respiratory: Normal respiratory effort, no problems with respiration noted  Abdomen: Soft, gravid, appropriate for gestational age.  Pain/Pressure: Absent     Pelvic: Cervical exam deferred        Extremities: Normal range of motion.  Edema: None  Mental Status: Normal mood and affect. Normal behavior. Normal judgment and thought content.   Assessment and Plan:  Pregnancy: G1P0 at 6320w5d  1. Supervision of normal first pregnancy, antepartum     Doing well  2. Vitamin D deficiency     Taking weekly vitamin D  3. Anemia during pregnancy in third trimester     Taking iron. Has had 1 iron transfusion, second one scheduled for 03/02/18.   Preterm labor symptoms and general obstetric precautions including but not limited to vaginal bleeding, contractions, leaking of fluid and  fetal movement were reviewed in detail with the patient. Please refer to After Visit Summary for other counseling recommendations.  Return in about 2 weeks (around 03/13/2018) for ROB.  Future Appointments  Date Time Provider Department Center  03/02/2018  9:00 AM [facility] ROOM 9 [facility] None    Roe Coombsachelle A Aurel Nguyen, CNM

## 2018-03-02 ENCOUNTER — Ambulatory Visit (HOSPITAL_COMMUNITY)
Admission: RE | Admit: 2018-03-02 | Discharge: 2018-03-02 | Disposition: A | Discharge status: Home / Self Care | Payer: BLUE CROSS/BLUE SHIELD | Source: Ambulatory Visit | Attending: Obstetrics | Admitting: Obstetrics

## 2018-03-02 DIAGNOSIS — O99019 Anemia complicating pregnancy, unspecified trimester: Secondary | ICD-10-CM | POA: Diagnosis present

## 2018-03-02 DIAGNOSIS — R790 Abnormal level of blood mineral: Secondary | ICD-10-CM | POA: Insufficient documentation

## 2018-03-02 DIAGNOSIS — Z3A00 Weeks of gestation of pregnancy not specified: Secondary | ICD-10-CM | POA: Diagnosis not present

## 2018-03-02 MED ORDER — SODIUM CHLORIDE 0.9 % IV SOLN
510.0000 mg | INTRAVENOUS | Status: AC
  Administered 2018-03-02: 510 mg via INTRAVENOUS
  Filled 2018-03-02: qty 17

## 2018-03-11 ENCOUNTER — Ambulatory Visit (INDEPENDENT_AMBULATORY_CARE_PROVIDER_SITE_OTHER): Payer: BLUE CROSS/BLUE SHIELD | Admitting: Nurse Practitioner

## 2018-03-11 VITALS — BP 113/72 | HR 84 | Wt 173.0 lb

## 2018-03-11 DIAGNOSIS — Z3403 Encounter for supervision of normal first pregnancy, third trimester: Secondary | ICD-10-CM

## 2018-03-11 DIAGNOSIS — Z3400 Encounter for supervision of normal first pregnancy, unspecified trimester: Secondary | ICD-10-CM

## 2018-03-11 NOTE — Progress Notes (Signed)
Patient reports good fetal, denies pain.

## 2018-03-11 NOTE — Patient Instructions (Signed)

## 2018-03-11 NOTE — Progress Notes (Signed)
    Subjective:  Tamara MeigsDazhanira Kelley is a 22 y.o. G1P0 at 2119w3d being seen today for ongoing prenatal care.  She is currently monitored for the following issues for this low-risk pregnancy and has Supervision of normal first pregnancy, antepartum; Chlamydia infection affecting pregnancy in first trimester, antepartum; Vitamin D deficiency; and Anemia in pregnancy on their problem list.  Patient reports no complaints.  Contractions: Not present. Vag. Bleeding: None.  Movement: Present. Denies leaking of fluid.   The following portions of the patient's history were reviewed and updated as appropriate: allergies, current medications, past family history, past medical history, past social history, past surgical history and problem list. Problem list updated.  Objective:   Vitals:   03/11/18 1535  BP: 113/72  Pulse: 84  Weight: 173 lb (78.5 kg)    Fetal Status: Fetal Heart Rate (bpm): 138 Fundal Height: 34 cm Movement: Present     General:  Alert, oriented and cooperative. Patient is in no acute distress.  Skin: Skin is warm and dry. No rash noted.   Cardiovascular: Normal heart rate noted  Respiratory: Normal respiratory effort, no problems with respiration noted  Abdomen: Soft, gravid, appropriate for gestational age. Pain/Pressure: Absent     Pelvic:  Cervical exam deferred        Extremities: Normal range of motion.  Edema: None  Mental Status: Normal mood and affect. Normal behavior. Normal judgment and thought content.   Urinalysis:      Assessment and Plan:  Pregnancy: G1P0 at 7519w3d  1. Supervision of normal first pregnancy, antepartum Discussed slow breathing techniques to use in labor as well as upright positioning. Encouraged to find at least an online class to attend. Completed 2 feraheme infusions  Preterm labor symptoms and general obstetric precautions including but not limited to vaginal bleeding, contractions, leaking of fluid and fetal movement were reviewed in detail  with the patient. Please refer to After Visit Summary for other counseling recommendations.  Return in about 2 weeks (around 03/25/2018).  Nolene BernheimERRI Orlene Salmons, RN, MSN, NP-BC Nurse Practitioner, [facility], [facility] 03/11/2018 4:03 PM

## 2018-03-25 ENCOUNTER — Encounter: Payer: BLUE CROSS/BLUE SHIELD | Admitting: Advanced Practice Midwife

## 2018-03-26 ENCOUNTER — Other Ambulatory Visit (HOSPITAL_COMMUNITY)
Admission: RE | Admit: 2018-03-26 | Discharge: 2018-03-26 | Disposition: A | Discharge status: Home / Self Care | Payer: BLUE CROSS/BLUE SHIELD | Source: Ambulatory Visit | Attending: Advanced Practice Midwife | Admitting: Advanced Practice Midwife

## 2018-03-26 ENCOUNTER — Other Ambulatory Visit: Payer: Self-pay

## 2018-03-26 ENCOUNTER — Ambulatory Visit (INDEPENDENT_AMBULATORY_CARE_PROVIDER_SITE_OTHER): Payer: BLUE CROSS/BLUE SHIELD | Admitting: Advanced Practice Midwife

## 2018-03-26 VITALS — BP 112/68 | HR 77 | Wt 178.2 lb

## 2018-03-26 DIAGNOSIS — O98813 Other maternal infectious and parasitic diseases complicating pregnancy, third trimester: Secondary | ICD-10-CM

## 2018-03-26 DIAGNOSIS — O98319 Other infections with a predominantly sexual mode of transmission complicating pregnancy, unspecified trimester: Secondary | ICD-10-CM

## 2018-03-26 DIAGNOSIS — A568 Sexually transmitted chlamydial infection of other sites: Secondary | ICD-10-CM

## 2018-03-26 DIAGNOSIS — Z3403 Encounter for supervision of normal first pregnancy, third trimester: Secondary | ICD-10-CM | POA: Diagnosis present

## 2018-03-26 DIAGNOSIS — A749 Chlamydial infection, unspecified: Secondary | ICD-10-CM

## 2018-03-26 DIAGNOSIS — O99210 Obesity complicating pregnancy, unspecified trimester: Secondary | ICD-10-CM

## 2018-03-26 DIAGNOSIS — O99213 Obesity complicating pregnancy, third trimester: Secondary | ICD-10-CM

## 2018-03-26 LAB — OB RESULTS CONSOLE GC/CHLAMYDIA: Gonorrhea: NEGATIVE

## 2018-03-26 NOTE — Progress Notes (Signed)
ROB GBS 

## 2018-03-26 NOTE — Patient Instructions (Signed)
Vaginal Delivery Vaginal delivery means that you will give birth by pushing your baby out of your birth canal (vagina). A team of health care providers will help you before, during, and after vaginal delivery. Birth experiences are unique for every woman and every pregnancy, and birth experiences vary depending on where you choose to give birth. What should I do to prepare for my baby's birth? Before your baby is born, it is important to talk with your health care provider about:  Your labor and delivery preferences. These may include: ? Medicines that you may be given. ? How you will manage your pain. This might include non-medical pain relief techniques or injectable pain relief such as epidural analgesia. ? How you and your baby will be monitored during labor and delivery. ? Who may be in the labor and delivery room with you. ? Your feelings about surgical delivery of your baby (cesarean delivery, or C-section) if this becomes necessary. ? Your feelings about receiving donated blood through an IV tube (blood transfusion) if this becomes necessary.  Whether you are able: ? To take pictures or videos of the birth. ? To eat during labor and delivery. ? To move around, walk, or change positions during labor and delivery.  What to expect after your baby is born, such as: ? Whether delayed umbilical cord clamping and cutting is offered. ? Who will care for your baby right after birth. ? Medicines or tests that may be recommended for your baby. ? Whether breastfeeding is supported in your hospital or birth center. ? How long you will be in the hospital or birth center.  How any medical conditions you have may affect your baby or your labor and delivery experience.  To prepare for your baby's birth, you should also:  Attend all of your health care visits before delivery (prenatal visits) as recommended by your health care provider. This is important.  Prepare your home for your baby's  arrival. Make sure that you have: ? Diapers. ? Baby clothing. ? Feeding equipment. ? Safe sleeping arrangements for you and your baby.  Install a car seat in your vehicle. Have your car seat checked by a certified car seat installer to make sure that it is installed safely.  Think about who will help you with your new baby at home for at least the first several weeks after delivery.  What can I expect when I arrive at the birth center or hospital? Once you are in labor and have been admitted into the hospital or birth center, your health care provider may:  Review your pregnancy history and any concerns you have.  Insert an IV tube into one of your veins. This is used to give you fluids and medicines.  Check your blood pressure, pulse, temperature, and heart rate (vital signs).  Check whether your bag of water (amniotic sac) has broken (ruptured).  Talk with you about your birth plan and discuss pain control options.  Monitoring Your health care provider may monitor your contractions (uterine monitoring) and your baby's heart rate (fetal monitoring). You may need to be monitored:  Often, but not continuously (intermittently).  All the time or for long periods at a time (continuously). Continuous monitoring may be needed if: ? You are taking certain medicines, such as medicine to relieve pain or make your contractions stronger. ? You have pregnancy or labor complications.  Monitoring may be done by:  Placing a special stethoscope or a handheld monitoring device on your abdomen to   check your baby's heartbeat, and feeling your abdomen for contractions. This method of monitoring does not continuously record your baby's heartbeat or your contractions.  Placing monitors on your abdomen (external monitors) to record your baby's heartbeat and the frequency and length of contractions. You may not have to wear external monitors all the time.  Placing monitors inside of your uterus  (internal monitors) to record your baby's heartbeat and the frequency, length, and strength of your contractions. ? Your health care provider may use internal monitors if he or she needs more information about the strength of your contractions or your baby's heart rate. ? Internal monitors are put in place by passing a thin, flexible wire through your vagina and into your uterus. Depending on the type of monitor, it may remain in your uterus or on your baby's head until birth. ? Your health care provider will discuss the benefits and risks of internal monitoring with you and will ask for your permission before inserting the monitors.  Telemetry. This is a type of continuous monitoring that can be done with external or internal monitors. Instead of having to stay in bed, you are able to move around during telemetry. Ask your health care provider if telemetry is an option for you.  Physical exam Your health care provider may perform a physical exam. This may include:  Checking whether your baby is positioned: ? With the head toward your vagina (head-down). This is most common. ? With the head toward the top of your uterus (head-up or breech). If your baby is in a breech position, your health care provider may try to turn your baby to a head-down position so you can deliver vaginally. If it does not seem that your baby can be born vaginally, your provider may recommend surgery to deliver your baby. In rare cases, you may be able to deliver vaginally if your baby is head-up (breech delivery). ? Lying sideways (transverse). Babies that are lying sideways cannot be delivered vaginally.  Checking your cervix to determine: ? Whether it is thinning out (effacing). ? Whether it is opening up (dilating). ? How low your baby has moved into your birth canal.  What are the three stages of labor and delivery?  Normal labor and delivery is divided into the following three stages: Stage 1  Stage 1 is the  longest stage of labor, and it can last for hours or days. Stage 1 includes: ? Early labor. This is when contractions may be irregular, or regular and mild. Generally, early labor contractions are more than 10 minutes apart. ? Active labor. This is when contractions get longer, more regular, more frequent, and more intense. ? The transition phase. This is when contractions happen very close together, are very intense, and may last longer than during any other part of labor.  Contractions generally feel mild, infrequent, and irregular at first. They get stronger, more frequent (about every 2-3 minutes), and more regular as you progress from early labor through active labor and transition.  Many women progress through stage 1 naturally, but you may need help to continue making progress. If this happens, your health care provider may talk with you about: ? Rupturing your amniotic sac if it has not ruptured yet. ? Giving you medicine to help make your contractions stronger and more frequent.  Stage 1 ends when your cervix is completely dilated to 4 inches (10 cm) and completely effaced. This happens at the end of the transition phase. Stage 2  Once   your cervix is completely effaced and dilated to 4 inches (10 cm), you may start to feel an urge to push. It is common for the body to naturally take a rest before feeling the urge to push, especially if you received an epidural or certain other pain medicines. This rest period may last for up to 1-2 hours, depending on your unique labor experience.  During stage 2, contractions are generally less painful, because pushing helps relieve contraction pain. Instead of contraction pain, you may feel stretching and burning pain, especially when the widest part of your baby's head passes through the vaginal opening (crowning).  Your health care provider will closely monitor your pushing progress and your baby's progress through the vagina during stage 2.  Your  health care provider may massage the area of skin between your vaginal opening and anus (perineum) or apply warm compresses to your perineum. This helps it stretch as the baby's head starts to crown, which can help prevent perineal tearing. ? In some cases, an incision may be made in your perineum (episiotomy) to allow the baby to pass through the vaginal opening. An episiotomy helps to make the opening of the vagina larger to allow more room for the baby to fit through.  It is very important to breathe and focus so your health care provider can control the delivery of your baby's head. Your health care provider may have you decrease the intensity of your pushing, to help prevent perineal tearing.  After delivery of your baby's head, the shoulders and the rest of the body generally deliver very quickly and without difficulty.  Once your baby is delivered, the umbilical cord may be cut right away, or this may be delayed for 1-2 minutes, depending on your baby's health. This may vary among health care providers, hospitals, and birth centers.  If you and your baby are healthy enough, your baby may be placed on your chest or abdomen to help maintain the baby's temperature and to help you bond with each other. Some mothers and babies start breastfeeding at this time. Your health care team will dry your baby and help keep your baby warm during this time.  Your baby may need immediate care if he or she: ? Showed signs of distress during labor. ? Has a medical condition. ? Was born too early (prematurely). ? Had a bowel movement before birth (meconium). ? Shows signs of difficulty transitioning from being inside the uterus to being outside of the uterus. If you are planning to breastfeed, your health care team will help you begin a feeding. Stage 3  The third stage of labor starts immediately after the birth of your baby and ends after you deliver the placenta. The placenta is an organ that develops  during pregnancy to provide oxygen and nutrients to your baby in the womb.  Delivering the placenta may require some pushing, and you may have mild contractions. Breastfeeding can stimulate contractions to help you deliver the placenta.  After the placenta is delivered, your uterus should tighten (contract) and become firm. This helps to stop bleeding in your uterus. To help your uterus contract and to control bleeding, your health care provider may: ? Give you medicine by injection, through an IV tube, by mouth, or through your rectum (rectally). ? Massage your abdomen or perform a vaginal exam to remove any blood clots that are left in your uterus. ? Empty your bladder by placing a thin, flexible tube (catheter) into your bladder. ? Encourage   you to breastfeed your baby. After labor is over, you and your baby will be monitored closely to ensure that you are both healthy until you are ready to go home. Your health care team will teach you how to care for yourself and your baby. This information is not intended to replace advice given to you by your health care provider. Make sure you discuss any questions you have with your health care provider. Document Released: 05/21/2008 Document Revised: 03/01/2016 Document Reviewed: 08/27/2015 Elsevier Interactive Patient Education  2018 Elsevier Inc[employer]  

## 2018-03-26 NOTE — Progress Notes (Signed)
   PRENATAL VISIT NOTE  Subjective:  Tamara Kelley is a 22 y.o. G1P0 at 242w4d being seen today for ongoing prenatal care.  She is currently monitored for the following issues for this low-risk pregnancy and has Supervision of normal first pregnancy, antepartum; Chlamydia infection affecting pregnancy in first trimester, antepartum; Vitamin D deficiency; and Anemia in pregnancy on their problem list.  Patient reports no complaints.  Contractions: Not present. Vag. Bleeding: None.  Movement: Present. Denies leaking of fluid.   The following portions of the patient's history were reviewed and updated as appropriate: allergies, current medications, past family history, past medical history, past social history, past surgical history and problem list. Problem list updated.  Objective:   Vitals:   03/26/18 1555  BP: 112/68  Pulse: 77  Weight: 178 lb 3.2 oz (80.8 kg)    Fetal Status: Fetal Heart Rate (bpm): 147(Simultaneous filing. User may not have seen previous data.) Fundal Height: 36 cm Movement: Present  Presentation: Vertex  General:  Alert, oriented and cooperative. Patient is in no acute distress.  Skin: Skin is warm and dry. No rash noted.   Cardiovascular: Normal heart rate noted  Respiratory: Normal respiratory effort, no problems with respiration noted  Abdomen: Soft, gravid, appropriate for gestational age.  Pain/Pressure: Absent     Pelvic: Cervical exam deferred   thick white vaginal discharge visible while collecting GBS. No foul smell  Extremities: Normal range of motion.  Edema: Trace  Mental Status: Normal mood and affect. Normal behavior. Normal judgment and thought content.   Assessment and Plan:  Pregnancy: G1P0 at 232w4d  1. Encounter for supervision of normal first pregnancy in third trimester - No concerns, continue routine care - Discussed location of [facility] and criteria for visiting - Strep Gp B NAA collected today  2. Chlamydia trachomatis infection in mother  during pregnancy, antepartum - [facility] Test [facility] cure [facility]24/17 - Thick white discharge visible on exam today, continues to be sexually active with same partner - Cervicovaginal ancillary only  3. Ob[facility] in [place] [facility], discussed lean meats, fruits and vegetables, prioritize hydration with water - EFW 44th percentile at 29w 4d - Fundal height appropriate for GA  Preterm labor symptoms and general obstetric precautions including but not limited to vaginal bleeding, contractions, leaking of fluid and fetal movement were reviewed in detail with the patient. Please refer to After Visit Summary for other counseling recommendations.  Return in about 1 week (around 04/02/2018).  No future appointments.  Calvert CantorSamantha C Weinhold, CNM  03/26/18  4:14 PM

## 2018-03-27 LAB — CERVICOVAGINAL ANCILLARY ONLY
CHLAMYDIA, DNA PROBE: NEGATIVE
Neisseria Gonorrhea: NEGATIVE

## 2018-03-28 ENCOUNTER — Encounter: Payer: Self-pay | Admitting: Advanced Practice Midwife

## 2018-03-28 DIAGNOSIS — B951 Streptococcus, group B, as the cause of diseases classified elsewhere: Secondary | ICD-10-CM | POA: Insufficient documentation

## 2018-03-28 LAB — STREP GP B NAA: STREP GROUP B AG: POSITIVE — AB

## 2018-03-31 ENCOUNTER — Other Ambulatory Visit: Payer: Self-pay

## 2018-03-31 ENCOUNTER — Encounter: Payer: Self-pay | Admitting: Obstetrics

## 2018-03-31 ENCOUNTER — Ambulatory Visit (INDEPENDENT_AMBULATORY_CARE_PROVIDER_SITE_OTHER): Payer: BLUE CROSS/BLUE SHIELD | Admitting: Obstetrics

## 2018-03-31 VITALS — BP 101/64 | HR 74 | Wt 176.0 lb

## 2018-03-31 DIAGNOSIS — Z3403 Encounter for supervision of normal first pregnancy, third trimester: Secondary | ICD-10-CM

## 2018-03-31 NOTE — Progress Notes (Signed)
Subjective:  Tamara Kelley is a 22 y.o. G1P0 at 7160w2d being seen today for ongoing prenatal care.  She is currently monitored for the following issues for this low-risk pregnancy and has Supervision of normal first pregnancy, antepartum; Chlamydia infection affecting pregnancy in first trimester, antepartum; Vitamin D deficiency; Anemia in pregnancy; Obesity in pregnancy; and Group beta Strep positive on their problem list.  Patient reports no complaints.  Contractions: Not present. Vag. Bleeding: None.  Movement: Present. Denies leaking of fluid.   The following portions of the patient's history were reviewed and updated as appropriate: allergies, current medications, past family history, past medical history, past social history, past surgical history and problem list. Problem list updated.  Objective:   Vitals:   03/31/18 0843  BP: 101/64  Pulse: 74  Weight: 176 lb (79.8 kg)    Fetal Status: Fetal Heart Rate (bpm): 140   Movement: Present     General:  Alert, oriented and cooperative. Patient is in no acute distress.  Skin: Skin is warm and dry. No rash noted.   Cardiovascular: Normal heart rate noted  Respiratory: Normal respiratory effort, no problems with respiration noted  Abdomen: Soft, gravid, appropriate for gestational age. Pain/Pressure: Absent     Pelvic:  Cervical exam deferred        Extremities: Normal range of motion.  Edema: None  Mental Status: Normal mood and affect. Normal behavior. Normal judgment and thought content.   Urinalysis:      Assessment and Plan:  Pregnancy: G1P0 at 4160w2d  1. Encounter for supervision of normal first pregnancy in third trimester   Term labor symptoms and general obstetric precautions including but not limited to vaginal bleeding, contractions, leaking of fluid and fetal movement were reviewed in detail with the patient. Please refer to After Visit Summary for other counseling recommendations.  Return in about 1 week (around  04/07/2018) for ROB.   Brock BadHarper, Oryon Gary A, MD

## 2018-04-07 ENCOUNTER — Ambulatory Visit (INDEPENDENT_AMBULATORY_CARE_PROVIDER_SITE_OTHER): Payer: BLUE CROSS/BLUE SHIELD | Admitting: Obstetrics

## 2018-04-07 ENCOUNTER — Encounter: Payer: Self-pay | Admitting: Obstetrics

## 2018-04-07 VITALS — BP 116/72 | HR 84 | Wt 178.4 lb

## 2018-04-07 DIAGNOSIS — O99213 Obesity complicating pregnancy, third trimester: Secondary | ICD-10-CM

## 2018-04-07 DIAGNOSIS — Z3403 Encounter for supervision of normal first pregnancy, third trimester: Secondary | ICD-10-CM

## 2018-04-07 DIAGNOSIS — Z3481 Encounter for supervision of other normal pregnancy, first trimester: Secondary | ICD-10-CM

## 2018-04-07 DIAGNOSIS — Z3400 Encounter for supervision of normal first pregnancy, unspecified trimester: Secondary | ICD-10-CM

## 2018-04-07 DIAGNOSIS — E669 Obesity, unspecified: Secondary | ICD-10-CM

## 2018-04-07 NOTE — Progress Notes (Signed)
Subjective:  Tamara Kelley is a 22 y.o. G1P0 at 5958w2d being seen today for ongoing prenatal care.  She is currently monitored for the following issues for this low-risk pregnancy and has Supervision of normal first pregnancy, antepartum; Chlamydia infection affecting pregnancy in first trimester, antepartum; Vitamin D deficiency; Anemia in pregnancy; Obesity in pregnancy; and Group beta Strep positive on their problem list.  Patient reports no complaints.  Contractions: Not present. Vag. Bleeding: None.  Movement: Present. Denies leaking of fluid.   The following portions of the patient's history were reviewed and updated as appropriate: allergies, current medications, past family history, past medical history, past social history, past surgical history and problem list. Problem list updated.  Objective:   Vitals:   04/07/18 1515  BP: 116/72  Pulse: 84  Weight: 178 lb 6.4 oz (80.9 kg)    Fetal Status: Fetal Heart Rate (bpm): 140   Movement: Present     General:  Alert, oriented and cooperative. Patient is in no acute distress.  Skin: Skin is warm and dry. No rash noted.   Cardiovascular: Normal heart rate noted  Respiratory: Normal respiratory effort, no problems with respiration noted  Abdomen: Soft, gravid, appropriate for gestational age. Pain/Pressure: Absent     Pelvic:  Cervical exam deferred        Extremities: Normal range of motion.  Edema: Trace  Mental Status: Normal mood and affect. Normal behavior. Normal judgment and thought content.   Urinalysis:      Assessment and Plan:  Pregnancy: G1P0 at 3458w2d  1. Supervision of normal first pregnancy, antepartum  2. Obesity in pregnancy, antepartum, third trimester   Term labor symptoms and general obstetric precautions including but not limited to vaginal bleeding, contractions, leaking of fluid and fetal movement were reviewed in detail with the patient. Please refer to After Visit Summary for other counseling  recommendations.  Return in about 1 week (around 04/14/2018) for ROB.   Brock BadHarper, Charles A, MD

## 2018-04-07 NOTE — Progress Notes (Signed)
Patient reports good fetal movement, denies pain. 

## 2018-04-15 ENCOUNTER — Encounter: Payer: Self-pay | Admitting: Obstetrics & Gynecology

## 2018-04-15 ENCOUNTER — Ambulatory Visit (INDEPENDENT_AMBULATORY_CARE_PROVIDER_SITE_OTHER): Payer: BLUE CROSS/BLUE SHIELD | Admitting: Obstetrics & Gynecology

## 2018-04-15 DIAGNOSIS — O99210 Obesity complicating pregnancy, unspecified trimester: Secondary | ICD-10-CM

## 2018-04-15 DIAGNOSIS — Z3403 Encounter for supervision of normal first pregnancy, third trimester: Secondary | ICD-10-CM

## 2018-04-15 DIAGNOSIS — Z23 Encounter for immunization: Secondary | ICD-10-CM

## 2018-04-15 DIAGNOSIS — Z3400 Encounter for supervision of normal first pregnancy, unspecified trimester: Secondary | ICD-10-CM

## 2018-04-15 DIAGNOSIS — E669 Obesity, unspecified: Secondary | ICD-10-CM

## 2018-04-15 DIAGNOSIS — O99213 Obesity complicating pregnancy, third trimester: Secondary | ICD-10-CM

## 2018-04-15 NOTE — Progress Notes (Signed)
   PRENATAL VISIT NOTE  Subjective:  Tamara Kelley is a 22 y.o. G1P0 at 6541w3d being seen today for ongoing prenatal care.  She is currently monitored for the following issues for this low-risk pregnancy and has Supervision of normal first pregnancy, antepartum; Chlamydia infection affecting pregnancy in first trimester, antepartum; Vitamin D deficiency; Anemia in pregnancy; Obesity in pregnancy; and Group beta Strep positive on their problem list.  Patient reports no complaints.  Contractions: Not present. Vag. Bleeding: None.  Movement: Present. Denies leaking of fluid.   The following portions of the patient's history were reviewed and updated as appropriate: allergies, current medications, past family history, past medical history, past social history, past surgical history and problem list. Problem list updated.  Objective:   Vitals:   04/15/18 1531  BP: 126/74  Pulse: 90  Weight: 83.3 kg    Fetal Status: Fetal Heart Rate (bpm): 140 Fundal Height: 38 cm Movement: Present  Presentation: Vertex  General:  Alert, oriented and cooperative. Patient is in no acute distress.  Skin: Skin is warm and dry. No rash noted.   Cardiovascular: Normal heart rate noted  Respiratory: Normal respiratory effort, no problems with respiration noted  Abdomen: Soft, gravid, appropriate for gestational age.  Pain/Pressure: Absent     Pelvic: Cervical exam performed Dilation: 2 Effacement (%): 50 Station: -3  Extremities: Normal range of motion.  Edema: Trace  Mental Status: Normal mood and affect. Normal behavior. Normal judgment and thought content.   Assessment and Plan:  Pregnancy: G1P0 at 2641w3d  1. Supervision of normal first pregnancy, antepartum Doing well  2. Obesity in pregnancy   Term labor symptoms and general obstetric precautions including but not limited to vaginal bleeding, contractions, leaking of fluid and fetal movement were reviewed in detail with the patient. Please refer to  After Visit Summary for other counseling recommendations.  Return in about 1 week (around 04/22/2018).  No future appointments.  Scheryl DarterJames Arnold, MD

## 2018-04-15 NOTE — Patient Instructions (Signed)
Vaginal Delivery Vaginal delivery means that you will give birth by pushing your baby out of your birth canal (vagina). A team of health care providers will help you before, during, and after vaginal delivery. Birth experiences are unique for every woman and every pregnancy, and birth experiences vary depending on where you choose to give birth. What should I do to prepare for my baby's birth? Before your baby is born, it is important to talk with your health care provider about:  Your labor and delivery preferences. These may include: ? Medicines that you may be given. ? How you will manage your pain. This might include non-medical pain relief techniques or injectable pain relief such as epidural analgesia. ? How you and your baby will be monitored during labor and delivery. ? Who may be in the labor and delivery room with you. ? Your feelings about surgical delivery of your baby (cesarean delivery, or C-section) if this becomes necessary. ? Your feelings about receiving donated blood through an IV tube (blood transfusion) if this becomes necessary.  Whether you are able: ? To take pictures or videos of the birth. ? To eat during labor and delivery. ? To move around, walk, or change positions during labor and delivery.  What to expect after your baby is born, such as: ? Whether delayed umbilical cord clamping and cutting is offered. ? Who will care for your baby right after birth. ? Medicines or tests that may be recommended for your baby. ? Whether breastfeeding is supported in your hospital or birth center. ? How long you will be in the hospital or birth center.  How any medical conditions you have may affect your baby or your labor and delivery experience.  To prepare for your baby's birth, you should also:  Attend all of your health care visits before delivery (prenatal visits) as recommended by your health care provider. This is important.  Prepare your home for your baby's  arrival. Make sure that you have: ? Diapers. ? Baby clothing. ? Feeding equipment. ? Safe sleeping arrangements for you and your baby.  Install a car seat in your vehicle. Have your car seat checked by a certified car seat installer to make sure that it is installed safely.  Think about who will help you with your new baby at home for at least the first several weeks after delivery.  What can I expect when I arrive at the birth center or hospital? Once you are in labor and have been admitted into the hospital or birth center, your health care provider may:  Review your pregnancy history and any concerns you have.  Insert an IV tube into one of your veins. This is used to give you fluids and medicines.  Check your blood pressure, pulse, temperature, and heart rate (vital signs).  Check whether your bag of water (amniotic sac) has broken (ruptured).  Talk with you about your birth plan and discuss pain control options.  Monitoring Your health care provider may monitor your contractions (uterine monitoring) and your baby's heart rate (fetal monitoring). You may need to be monitored:  Often, but not continuously (intermittently).  All the time or for long periods at a time (continuously). Continuous monitoring may be needed if: ? You are taking certain medicines, such as medicine to relieve pain or make your contractions stronger. ? You have pregnancy or labor complications.  Monitoring may be done by:  Placing a special stethoscope or a handheld monitoring device on your abdomen to   check your baby's heartbeat, and feeling your abdomen for contractions. This method of monitoring does not continuously record your baby's heartbeat or your contractions.  Placing monitors on your abdomen (external monitors) to record your baby's heartbeat and the frequency and length of contractions. You may not have to wear external monitors all the time.  Placing monitors inside of your uterus  (internal monitors) to record your baby's heartbeat and the frequency, length, and strength of your contractions. ? Your health care provider may use internal monitors if he or she needs more information about the strength of your contractions or your baby's heart rate. ? Internal monitors are put in place by passing a thin, flexible wire through your vagina and into your uterus. Depending on the type of monitor, it may remain in your uterus or on your baby's head until birth. ? Your health care provider will discuss the benefits and risks of internal monitoring with you and will ask for your permission before inserting the monitors.  Telemetry. This is a type of continuous monitoring that can be done with external or internal monitors. Instead of having to stay in bed, you are able to move around during telemetry. Ask your health care provider if telemetry is an option for you.  Physical exam Your health care provider may perform a physical exam. This may include:  Checking whether your baby is positioned: ? With the head toward your vagina (head-down). This is most common. ? With the head toward the top of your uterus (head-up or breech). If your baby is in a breech position, your health care provider may try to turn your baby to a head-down position so you can deliver vaginally. If it does not seem that your baby can be born vaginally, your provider may recommend surgery to deliver your baby. In rare cases, you may be able to deliver vaginally if your baby is head-up (breech delivery). ? Lying sideways (transverse). Babies that are lying sideways cannot be delivered vaginally.  Checking your cervix to determine: ? Whether it is thinning out (effacing). ? Whether it is opening up (dilating). ? How low your baby has moved into your birth canal.  What are the three stages of labor and delivery?  Normal labor and delivery is divided into the following three stages: Stage 1  Stage 1 is the  longest stage of labor, and it can last for hours or days. Stage 1 includes: ? Early labor. This is when contractions may be irregular, or regular and mild. Generally, early labor contractions are more than 10 minutes apart. ? Active labor. This is when contractions get longer, more regular, more frequent, and more intense. ? The transition phase. This is when contractions happen very close together, are very intense, and may last longer than during any other part of labor.  Contractions generally feel mild, infrequent, and irregular at first. They get stronger, more frequent (about every 2-3 minutes), and more regular as you progress from early labor through active labor and transition.  Many women progress through stage 1 naturally, but you may need help to continue making progress. If this happens, your health care provider may talk with you about: ? Rupturing your amniotic sac if it has not ruptured yet. ? Giving you medicine to help make your contractions stronger and more frequent.  Stage 1 ends when your cervix is completely dilated to 4 inches (10 cm) and completely effaced. This happens at the end of the transition phase. Stage 2  Once   your cervix is completely effaced and dilated to 4 inches (10 cm), you may start to feel an urge to push. It is common for the body to naturally take a rest before feeling the urge to push, especially if you received an epidural or certain other pain medicines. This rest period may last for up to 1-2 hours, depending on your unique labor experience.  During stage 2, contractions are generally less painful, because pushing helps relieve contraction pain. Instead of contraction pain, you may feel stretching and burning pain, especially when the widest part of your baby's head passes through the vaginal opening (crowning).  Your health care provider will closely monitor your pushing progress and your baby's progress through the vagina during stage 2.  Your  health care provider may massage the area of skin between your vaginal opening and anus (perineum) or apply warm compresses to your perineum. This helps it stretch as the baby's head starts to crown, which can help prevent perineal tearing. ? In some cases, an incision may be made in your perineum (episiotomy) to allow the baby to pass through the vaginal opening. An episiotomy helps to make the opening of the vagina larger to allow more room for the baby to fit through.  It is very important to breathe and focus so your health care provider can control the delivery of your baby's head. Your health care provider may have you decrease the intensity of your pushing, to help prevent perineal tearing.  After delivery of your baby's head, the shoulders and the rest of the body generally deliver very quickly and without difficulty.  Once your baby is delivered, the umbilical cord may be cut right away, or this may be delayed for 1-2 minutes, depending on your baby's health. This may vary among health care providers, hospitals, and birth centers.  If you and your baby are healthy enough, your baby may be placed on your chest or abdomen to help maintain the baby's temperature and to help you bond with each other. Some mothers and babies start breastfeeding at this time. Your health care team will dry your baby and help keep your baby warm during this time.  Your baby may need immediate care if he or she: ? Showed signs of distress during labor. ? Has a medical condition. ? Was born too early (prematurely). ? Had a bowel movement before birth (meconium). ? Shows signs of difficulty transitioning from being inside the uterus to being outside of the uterus. If you are planning to breastfeed, your health care team will help you begin a feeding. Stage 3  The third stage of labor starts immediately after the birth of your baby and ends after you deliver the placenta. The placenta is an organ that develops  during pregnancy to provide oxygen and nutrients to your baby in the womb.  Delivering the placenta may require some pushing, and you may have mild contractions. Breastfeeding can stimulate contractions to help you deliver the placenta.  After the placenta is delivered, your uterus should tighten (contract) and become firm. This helps to stop bleeding in your uterus. To help your uterus contract and to control bleeding, your health care provider may: ? Give you medicine by injection, through an IV tube, by mouth, or through your rectum (rectally). ? Massage your abdomen or perform a vaginal exam to remove any blood clots that are left in your uterus. ? Empty your bladder by placing a thin, flexible tube (catheter) into your bladder. ? Encourage   you to breastfeed your baby. After labor is over, you and your baby will be monitored closely to ensure that you are both healthy until you are ready to go home. Your health care team will teach you how to care for yourself and your baby. This information is not intended to replace advice given to you by your health care provider. Make sure you discuss any questions you have with your health care provider. Document Released: 05/21/2008 Document Revised: 03/01/2016 Document Reviewed: 08/27/2015 Elsevier Interactive Patient Education  2018 Elsevier Inc[employer]  

## 2018-04-16 ENCOUNTER — Telehealth (HOSPITAL_COMMUNITY): Payer: Self-pay | Admitting: *Deleted

## 2018-04-16 NOTE — Telephone Encounter (Signed)
Preadmission screen  

## 2018-04-21 ENCOUNTER — Other Ambulatory Visit: Payer: Self-pay | Admitting: Advanced Practice Midwife

## 2018-04-22 ENCOUNTER — Ambulatory Visit (INDEPENDENT_AMBULATORY_CARE_PROVIDER_SITE_OTHER): Payer: BLUE CROSS/BLUE SHIELD | Admitting: Family Medicine

## 2018-04-22 DIAGNOSIS — Z3400 Encounter for supervision of normal first pregnancy, unspecified trimester: Secondary | ICD-10-CM

## 2018-04-22 NOTE — Progress Notes (Signed)
   PRENATAL VISIT NOTE  Subjective:  Tamara Kelley is a 22 y.o. G1P0 at 6173w3d being seen today for ongoing prenatal care.  She is currently monitored for the following issues for this low-risk pregnancy and has Supervision of normal first pregnancy, antepartum; Chlamydia infection affecting pregnancy in first trimester, antepartum; Vitamin D deficiency; Anemia in pregnancy; Obesity in pregnancy; and Group beta Strep positive on their problem list.  Patient reports no complaints.  Contractions: Not present. Vag. Bleeding: None.  Movement: Present. Denies leaking of fluid.   The following portions of the patient's history were reviewed and updated as appropriate: allergies, current medications, past family history, past medical history, past social history, past surgical history and problem list. Problem list updated.  Objective:   Vitals:   04/22/18 1615  BP: 123/69  Pulse: 76  Weight: 181 lb (82.1 kg)    Fetal Status: Fetal Heart Rate (bpm): NST Fundal Height: 39 cm Movement: Present  Presentation: Vertex  General:  Alert, oriented and cooperative. Patient is in no acute distress.  Skin: Skin is warm and dry. No rash noted.   Cardiovascular: Normal heart rate noted  Respiratory: Normal respiratory effort, no problems with respiration noted  Abdomen: Soft, gravid, appropriate for gestational age.  Pain/Pressure: Absent     Pelvic: Cervical exam deferred        Extremities: Normal range of motion.     Mental Status: Normal mood and affect. Normal behavior. Normal judgment and thought content.   Assessment and Plan:  Pregnancy: G1P0 at 5473w3d  1. Supervision of normal first pregnancy, antepartum Post dates for IOL at 41 wks Declined membrane stripping today  NST:  Baseline: 130 bpm, Variability: Good {> 6 bpm), Accelerations: Reactive and Decelerations: Absent  Term labor symptoms and general obstetric precautions including but not limited to vaginal bleeding, contractions,  leaking of fluid and fetal movement were reviewed in detail with the patient. Please refer to After Visit Summary for other counseling recommendations.  Return in 6 weeks (on 06/03/2018) for pp check.  Future Appointments  Date Time Provider Department Center  04/26/2018  7:30 AM [facility] RO[facility] None    Reva Boresanya S Solace Wendorff, MD

## 2018-04-22 NOTE — Patient Instructions (Signed)

## 2018-04-26 ENCOUNTER — Inpatient Hospital Stay (HOSPITAL_COMMUNITY)
Admission: RE | Admit: 2018-04-26 | Payer: BLUE CROSS/BLUE SHIELD | Source: Ambulatory Visit | Attending: Family Medicine | Admitting: Family Medicine

## 2018-04-26 ENCOUNTER — Other Ambulatory Visit: Payer: Self-pay

## 2018-04-26 ENCOUNTER — Inpatient Hospital Stay (HOSPITAL_COMMUNITY)
Admission: EM | Admit: 2018-04-26 | Discharge: 2018-04-29 | DRG: 806 | Disposition: A | Discharge status: Home / Self Care | Payer: BLUE CROSS/BLUE SHIELD | Attending: Obstetrics and Gynecology | Admitting: Obstetrics and Gynecology

## 2018-04-26 ENCOUNTER — Encounter (HOSPITAL_COMMUNITY): Payer: Self-pay | Admitting: Emergency Medicine

## 2018-04-26 DIAGNOSIS — Z3A41 41 weeks gestation of pregnancy: Secondary | ICD-10-CM | POA: Diagnosis not present

## 2018-04-26 DIAGNOSIS — O99324 Drug use complicating childbirth: Secondary | ICD-10-CM | POA: Diagnosis present

## 2018-04-26 DIAGNOSIS — F1210 Cannabis abuse, uncomplicated: Secondary | ICD-10-CM | POA: Diagnosis present

## 2018-04-26 DIAGNOSIS — O99824 Streptococcus B carrier state complicating childbirth: Secondary | ICD-10-CM | POA: Diagnosis present

## 2018-04-26 DIAGNOSIS — O480 Post-term pregnancy: Principal | ICD-10-CM | POA: Diagnosis present

## 2018-04-26 DIAGNOSIS — O99210 Obesity complicating pregnancy, unspecified trimester: Secondary | ICD-10-CM

## 2018-04-26 DIAGNOSIS — B951 Streptococcus, group B, as the cause of diseases classified elsewhere: Secondary | ICD-10-CM

## 2018-04-26 LAB — TYPE AND SCREEN
ABO/RH(D): A POS
Antibody Screen: NEGATIVE

## 2018-04-26 LAB — CBC
HEMATOCRIT: 38.3 % (ref 36.0–46.0)
Hemoglobin: 12.6 g/dL (ref 12.0–15.0)
MCH: 29 pg (ref 26.0–34.0)
MCHC: 32.9 g/dL (ref 30.0–36.0)
MCV: 88.2 fL (ref 78.0–100.0)
PLATELETS: 208 10*3/uL (ref 150–400)
RBC: 4.34 MIL/uL (ref 3.87–5.11)
RDW: 17.5 % — AB (ref 11.5–15.5)
WBC: 6.4 10*3/uL (ref 4.0–10.5)

## 2018-04-26 LAB — ABO/RH: ABO/RH(D): A POS

## 2018-04-26 MED ORDER — OXYCODONE-ACETAMINOPHEN 5-325 MG PO TABS: 2.0000 | ORAL_TABLET | ORAL | Status: DC | PRN

## 2018-04-26 MED ORDER — OXYTOCIN 40 UNITS IN LACTATED RINGERS INFUSION - SIMPLE MED
2.5000 [IU]/h | INTRAVENOUS | Status: DC
  Administered 2018-04-27: 2.5 [IU]/h via INTRAVENOUS

## 2018-04-26 MED ORDER — ACETAMINOPHEN 325 MG PO TABS: 650.0000 mg | ORAL_TABLET | ORAL | Status: DC | PRN

## 2018-04-26 MED ORDER — SODIUM CHLORIDE 0.9 % IV SOLN
5.0000 10*6.[IU] | Freq: Once | INTRAVENOUS | Status: AC
  Administered 2018-04-26: 5 10*6.[IU] via INTRAVENOUS
  Filled 2018-04-26: qty 5

## 2018-04-26 MED ORDER — PENICILLIN G 3 MILLION UNITS IVPB - SIMPLE MED
3.0000 10*6.[IU] | INTRAVENOUS | Status: DC
  Administered 2018-04-26 – 2018-04-27 (×5): 3 10*6.[IU] via INTRAVENOUS
  Filled 2018-04-26: qty 100
  Filled 2018-04-26 (×3): qty 3
  Filled 2018-04-26: qty 100
  Filled 2018-04-26: qty 3
  Filled 2018-04-26 (×2): qty 100
  Filled 2018-04-26: qty 3

## 2018-04-26 MED ORDER — FENTANYL CITRATE (PF) 100 MCG/2ML IJ SOLN: 100.0000 ug | INTRAMUSCULAR | Status: DC | PRN

## 2018-04-26 MED ORDER — OXYTOCIN 40 UNITS IN LACTATED RINGERS INFUSION - SIMPLE MED
1.0000 m[IU]/min | INTRAVENOUS | Status: DC
  Filled 2018-04-26: qty 1000

## 2018-04-26 MED ORDER — SOD CITRATE-CITRIC ACID 500-334 MG/5ML PO SOLN: 30.0000 mL | ORAL | Status: DC | PRN

## 2018-04-26 MED ORDER — OXYTOCIN 40 UNITS IN LACTATED RINGERS INFUSION - SIMPLE MED
1.0000 m[IU]/min | INTRAVENOUS | Status: DC
  Administered 2018-04-27: 2 m[IU]/min via INTRAVENOUS

## 2018-04-26 MED ORDER — LACTATED RINGERS IV SOLN: 500.0000 mL | INTRAVENOUS | Status: DC | PRN

## 2018-04-26 MED ORDER — TERBUTALINE SULFATE 1 MG/ML IJ SOLN
0.2500 mg | Freq: Once | INTRAMUSCULAR | Status: DC | PRN
  Filled 2018-04-26: qty 1

## 2018-04-26 MED ORDER — MISOPROSTOL 50MCG HALF TABLET
50.0000 ug | ORAL_TABLET | ORAL | Status: DC | PRN
  Administered 2018-04-26: 50 ug via BUCCAL
  Filled 2018-04-26 (×2): qty 1

## 2018-04-26 MED ORDER — ONDANSETRON HCL 4 MG/2ML IJ SOLN: 4.0000 mg | Freq: Four times a day (QID) | INTRAMUSCULAR | Status: DC | PRN

## 2018-04-26 MED ORDER — LIDOCAINE HCL (PF) 1 % IJ SOLN
30.0000 mL | INTRAMUSCULAR | Status: DC | PRN
  Filled 2018-04-26: qty 30

## 2018-04-26 MED ORDER — LACTATED RINGERS IV SOLN
INTRAVENOUS | Status: DC
  Administered 2018-04-26: 800 mL via INTRAVENOUS
  Administered 2018-04-27 (×2): via INTRAVENOUS

## 2018-04-26 MED ORDER — OXYCODONE-ACETAMINOPHEN 5-325 MG PO TABS: 1.0000 | ORAL_TABLET | ORAL | Status: DC | PRN

## 2018-04-26 MED ORDER — ZOLPIDEM TARTRATE 5 MG PO TABS: 5.0000 mg | ORAL_TABLET | Freq: Every evening | ORAL | Status: DC | PRN

## 2018-04-26 MED ORDER — OXYTOCIN BOLUS FROM INFUSION
500.0000 mL | Freq: Once | INTRAVENOUS | Status: AC
  Administered 2018-04-27: 500 mL via INTRAVENOUS

## 2018-04-26 NOTE — Progress Notes (Signed)
Patient ID: Tamara Kelley, female   DOB: [birth year], 22 y.o.   MRN: [id number] Doing well, not hurting much  Vitals:   04/26/18 1800 04/26/18 1900 04/26/18 1925 04/26/18 2000  BP: 123/68 127/82 128/88 124/79  Pulse: 61 77 88 79  Resp:   16 16  Temp:   98.3 F (36.8 C)   TempSrc:   Oral   Weight:      Height:       FHR reactive UCs every 3 min  Dilation: 2 Effacement (%): 80 Cervical Position: Middle Station: [place], -2 Presentation: Vertex Exam by:: Milus Glazier RN  Foley inserted.  Will continue to observe

## 2018-04-26 NOTE — Anesthesia Pain Management Evaluation Note (Signed)
  CRNA Pain Management Visit Note  Patient: Tamara Kelley, 22 y.o., female  "Hello I am a member of the anesthesia team at [facility]. We have an anesthesia team available at all times to provide care throughout the hospital, including epidural management and anesthesia for C-section. I don't know your plan for the delivery whether it a natural birth, water birth, IV sedation, nitrous supplementation, doula or epidural, but we want to meet your pain goals."   1.Was your pain managed to your expectations on prior hospitalizations?   No prior hospitalizations  2.What is your expectation for pain management during this hospitalization?     Epidural  3.How can we help you reach that goal? *support**  Record the patient's initial score and the patient's pain goal.   Pain: 0  Pain Goal: 6 [facility] wants you to be able to say your pain was always managed very well.  Trellis Paganini 04/26/2018

## 2018-04-26 NOTE — Progress Notes (Signed)
Patient ID: Tamara Kelley, female   DOB: 1995/12/14, 22 y.o.   MRN: [id number] Doing well  Vitals:   04/26/18 1900 04/26/18 1925 04/26/18 2000 04/26/18 2100  BP: 127/82 128/88 124/79 135/90  Pulse: 77 88 79 82  Resp:  16 16 16   Temp:  98.3 F (36.8 C)    TempSrc:  Oral    Weight:      Height:       FHR reassuring UCs not tracing well  Foley fell out  Dilation: 5 Effacement (%): 80 Cervical Position: Middle Station: -2 Presentation: Vertex Exam by:: Artelia Laroche CNM  AROM clear IUPC inserted  Will start Pitocin 2x2

## 2018-04-26 NOTE — H&P (Signed)
Tamara Kelley is a 22 y.o. female G1 @ 53 wkspresenting for IOL for post dates.GBS pos OB History    Gravida  1   Para      Term      Preterm      AB      Living        SAB      TAB      Ectopic      Multiple      Live Births             Past Medical History:  Diagnosis Date  . Known health problems: none    Past Surgical History:  Procedure Laterality Date  . NO PAST SURGERIES     Family History: family history includes Aneurysm in her mother and paternal grandmother; Cancer in her paternal grandfather; Hypertension in her maternal grandmother and mother. Social History:  reports that she has never smoked. She has never used smokeless tobacco. She reports that she has current or past drug history. Drug: Marijuana. She reports that she does not drink alcohol.     Maternal Diabetes: No Genetic Screening: Normal Maternal Ultrasounds/Referrals: Normal Fetal Ultrasounds or other Referrals:  None Maternal Substance Abuse:  THC Significant Maternal Medications:  None Significant Maternal Lab Results:  None Other Comments:  None  Review of Systems  Constitutional: Negative.   HENT: Negative.   Eyes: Negative.   Respiratory: Negative.   Cardiovascular: Negative.   Gastrointestinal: Negative.   Genitourinary: Negative.   Musculoskeletal: Negative.   Skin: Negative.   Neurological: Negative.   Endo/Heme/Allergies: Negative.   Psychiatric/Behavioral: Negative.    Maternal Medical History:  Reason for admission: IOL for post dates  Contractions: Perceived severity is mild.   rare  Fetal activity: Perceived fetal activity is normal.   Last perceived fetal movement was within the past hour.    Prenatal complications: no prenatal complications Prenatal Complications - Diabetes: none.    Dilation: 2 Effacement (%): 60 Station: -2 Exam by:: Milus Glazier RN Blood pressure 130/78, pulse 88, temperature 98.6 F (37 C), temperature source Oral,  resp. rate 16, height 5\' 2"  (1.575 m), weight 82.5 kg, last menstrual period 07/13/2017. Maternal Exam:  Uterine Assessment: Contraction strength is mild.  Contraction frequency is rare.   Abdomen: Patient reports no abdominal tenderness. Estimated fetal weight is 7.0.   Fetal presentation: vertex  Introitus: Normal vulva. Normal vagina.  Ferning test: not done.  Nitrazine test: not done. Amniotic fluid character: not assessed.  Pelvis: adequate for delivery.   Cervix: Cervix evaluated by digital exam.     Fetal Exam Fetal Monitor Review: Mode: ultrasound.   Variability: moderate (6-25 bpm).   Pattern: accelerations present and no decelerations.    Fetal State Assessment: Category I - tracings are normal.     Physical Exam  Constitutional: She is oriented to person, place, and time. She appears well-developed and well-nourished.  HENT:  Head: Normocephalic.  Eyes: Pupils are equal, round, and reactive to light.  Neck: Normal range of motion.  Cardiovascular: Normal rate, regular rhythm, normal heart sounds and intact distal pulses.  Respiratory: Breath sounds normal.  GI: Soft. Bowel sounds are normal.  Genitourinary: Vagina normal.  Musculoskeletal: Normal range of motion.  Neurological: She is alert and oriented to person, place, and time. She has normal reflexes.  Skin: Skin is warm and dry.  Psychiatric: She has a normal mood and affect. Her behavior is normal. Judgment and thought content normal.  Prenatal labs: ABO, Rh: --/--/A POS, A POS Performed at [facility], [address] [place], [place] [address]  [phone]) Antibody: NEG (09/01 1429) Rubella: 2.52 (02/07 1346) RPR: Non Reactive (05/29 1015)  HBsAg: Negative (02/07 1346)  HIV: Non Reactive (05/29 1015)  GBS: Positive (08/01 1642)   Assessment/Plan: Preg 40.0 wks IOL for post dates GBS pos SVE 2-3 /70/-2/vertex Pitocin induction of labor  Wyvonnia Dusky 04/26/2018, 4:43 PM

## 2018-04-27 ENCOUNTER — Inpatient Hospital Stay (HOSPITAL_COMMUNITY): Payer: BLUE CROSS/BLUE SHIELD | Admitting: Anesthesiology

## 2018-04-27 ENCOUNTER — Encounter (HOSPITAL_COMMUNITY): Payer: Self-pay | Admitting: *Deleted

## 2018-04-27 DIAGNOSIS — O480 Post-term pregnancy: Secondary | ICD-10-CM

## 2018-04-27 DIAGNOSIS — Z3A41 41 weeks gestation of pregnancy: Secondary | ICD-10-CM

## 2018-04-27 DIAGNOSIS — O99824 Streptococcus B carrier state complicating childbirth: Secondary | ICD-10-CM

## 2018-04-27 LAB — RPR: RPR Ser Ql: NONREACTIVE

## 2018-04-27 MED ORDER — LACTATED RINGERS AMNIOINFUSION
INTRAVENOUS | Status: DC
  Administered 2018-04-27 (×2): via INTRAUTERINE
  Filled 2018-04-27 (×2): qty 1000

## 2018-04-27 MED ORDER — OXYCODONE-ACETAMINOPHEN 5-325 MG PO TABS: 2.0000 | ORAL_TABLET | ORAL | Status: DC | PRN

## 2018-04-27 MED ORDER — COCONUT OIL OIL
1.0000 "application " | TOPICAL_OIL | Status: DC | PRN
  Filled 2018-04-27: qty 120

## 2018-04-27 MED ORDER — DIBUCAINE 1 % RE OINT
1.0000 "application " | TOPICAL_OINTMENT | RECTAL | Status: DC | PRN
  Administered 2018-04-27: 1 via RECTAL
  Filled 2018-04-27: qty 28

## 2018-04-27 MED ORDER — FERROUS SULFATE 325 (65 FE) MG PO TABS
325.0000 mg | ORAL_TABLET | Freq: Two times a day (BID) | ORAL | Status: DC
  Administered 2018-04-27 – 2018-04-29 (×4): 325 mg via ORAL
  Filled 2018-04-27 (×4): qty 1

## 2018-04-27 MED ORDER — METHYLERGONOVINE MALEATE 0.2 MG PO TABS: 0.2000 mg | ORAL_TABLET | ORAL | Status: DC | PRN

## 2018-04-27 MED ORDER — OXYCODONE-ACETAMINOPHEN 5-325 MG PO TABS: 1.0000 | ORAL_TABLET | ORAL | Status: DC | PRN

## 2018-04-27 MED ORDER — EPHEDRINE 5 MG/ML INJ
10.0000 mg | INTRAVENOUS | Status: DC | PRN
  Filled 2018-04-27: qty 2

## 2018-04-27 MED ORDER — ONDANSETRON HCL 4 MG PO TABS: 4.0000 mg | ORAL_TABLET | ORAL | Status: DC | PRN

## 2018-04-27 MED ORDER — LACTATED RINGERS IV SOLN
500.0000 mL | Freq: Once | INTRAVENOUS | Status: AC
  Administered 2018-04-27: 500 mL via INTRAVENOUS

## 2018-04-27 MED ORDER — PHENYLEPHRINE 40 MCG/ML (10ML) SYRINGE FOR IV PUSH (FOR BLOOD PRESSURE SUPPORT)
80.0000 ug | PREFILLED_SYRINGE | INTRAVENOUS | Status: DC | PRN
  Filled 2018-04-27: qty 5

## 2018-04-27 MED ORDER — ACETAMINOPHEN 325 MG PO TABS: 650.0000 mg | ORAL_TABLET | ORAL | Status: DC | PRN

## 2018-04-27 MED ORDER — FENTANYL 2.5 MCG/ML BUPIVACAINE 1/10 % EPIDURAL INFUSION (WH - ANES)
14.0000 mL/h | INTRAMUSCULAR | Status: DC | PRN
  Administered 2018-04-27 (×2): 14 mL/h via EPIDURAL
  Filled 2018-04-27: qty 100

## 2018-04-27 MED ORDER — WITCH HAZEL-GLYCERIN EX PADS: 1.0000 "application " | MEDICATED_PAD | CUTANEOUS | Status: DC | PRN

## 2018-04-27 MED ORDER — BENZOCAINE-MENTHOL 20-0.5 % EX AERO
1.0000 "application " | INHALATION_SPRAY | CUTANEOUS | Status: DC | PRN
  Administered 2018-04-27: 1 via TOPICAL
  Filled 2018-04-27: qty 56

## 2018-04-27 MED ORDER — METHYLERGONOVINE MALEATE 0.2 MG/ML IJ SOLN
INTRAMUSCULAR | Status: AC
  Administered 2018-04-27: 0.2 mg
  Filled 2018-04-27: qty 1

## 2018-04-27 MED ORDER — MAGNESIUM HYDROXIDE 400 MG/5ML PO SUSP: 30.0000 mL | ORAL | Status: DC | PRN

## 2018-04-27 MED ORDER — LIDOCAINE HCL (PF) 1 % IJ SOLN
INTRAMUSCULAR | Status: DC | PRN
  Administered 2018-04-27: 5 mL via EPIDURAL

## 2018-04-27 MED ORDER — METHYLERGONOVINE MALEATE 0.2 MG/ML IJ SOLN: 0.2000 mg | INTRAMUSCULAR | Status: DC | PRN

## 2018-04-27 MED ORDER — EPHEDRINE 5 MG/ML INJ: 10.0000 mg | INTRAVENOUS | Status: DC | PRN

## 2018-04-27 MED ORDER — IBUPROFEN 600 MG PO TABS
600.0000 mg | ORAL_TABLET | Freq: Four times a day (QID) | ORAL | Status: DC
  Administered 2018-04-27 – 2018-04-29 (×7): 600 mg via ORAL
  Filled 2018-04-27 (×8): qty 1

## 2018-04-27 MED ORDER — PHENYLEPHRINE 40 MCG/ML (10ML) SYRINGE FOR IV PUSH (FOR BLOOD PRESSURE SUPPORT)
PREFILLED_SYRINGE | INTRAVENOUS | Status: AC
  Filled 2018-04-27: qty 10

## 2018-04-27 MED ORDER — FENTANYL 2.5 MCG/ML BUPIVACAINE 1/10 % EPIDURAL INFUSION (WH - ANES)
INTRAMUSCULAR | Status: AC
  Filled 2018-04-27: qty 100

## 2018-04-27 MED ORDER — PRENATAL MULTIVITAMIN CH
1.0000 | ORAL_TABLET | Freq: Every day | ORAL | Status: DC
  Administered 2018-04-28 – 2018-04-29 (×2): 1 via ORAL
  Filled 2018-04-27 (×2): qty 1

## 2018-04-27 MED ORDER — DIPHENHYDRAMINE HCL 50 MG/ML IJ SOLN: 12.5000 mg | INTRAMUSCULAR | Status: DC | PRN

## 2018-04-27 MED ORDER — TETANUS-DIPHTH-ACELL PERTUSSIS 5-2.5-18.5 LF-MCG/0.5 IM SUSP: 0.5000 mL | Freq: Once | INTRAMUSCULAR | Status: DC

## 2018-04-27 MED ORDER — SIMETHICONE 80 MG PO CHEW: 80.0000 mg | CHEWABLE_TABLET | ORAL | Status: DC | PRN

## 2018-04-27 MED ORDER — ONDANSETRON HCL 4 MG/2ML IJ SOLN: 4.0000 mg | INTRAMUSCULAR | Status: DC | PRN

## 2018-04-27 MED ORDER — MEASLES, MUMPS & RUBELLA VAC ~~LOC~~ INJ: 0.5000 mL | INJECTION | Freq: Once | SUBCUTANEOUS | Status: DC

## 2018-04-27 MED ORDER — DIPHENHYDRAMINE HCL 25 MG PO CAPS: 25.0000 mg | ORAL_CAPSULE | Freq: Four times a day (QID) | ORAL | Status: DC | PRN

## 2018-04-27 NOTE — Lactation Note (Signed)
This note was copied from a baby's chart. Lactation Consultation Note  Patient Name: Tamara Kelley MOLMB'E Date: 04/27/2018 Reason for consult: Initial assessment;Term;1st time breastfeeding P1, 9 hour female infant Per mom, did not attend any BF classes in her pregnancy. Interested in applying for [facility] program in [place]. [facility] enter room nurse was assisting w/ latch. Mom reported pinching at  her breast w./ latch. Infant would not sustain latch using the cross cradle position. LC asked mom to try football hold, mom was receptive of this position. Infant latched with wide  mouth gape and LC lowered bottom jaw for a deeper latch  Infant had rthymitic sucking pattern and infant sustained latch for 15 minutes. No swallowing was heard, LC asked mom to hand expression and LC notice that colostrum is not present at this time. LC will have mom pump in addition to BF to help with breast stimulation and induction, LC will ask RN to set up  DEBP w/ mom.  Mom will pump 3q hrs. LC discussed I&O. Mom will BF according hunger cues, 8 to 12 times within 24 hours including nights. Reviewed Baby & Me book's Breastfeeding Basics.  Mom made aware of O/P services, breastfeeding support groups, community resources, and our phone # for post-discharge questions.  Parent has LC# to call if they have any further questions or concerns. Maternal Data Formula Feeding for Exclusion: No Has patient been taught Hand Expression?: Yes Does the patient have breastfeeding experience prior to this delivery?: No  Feeding Feeding Type: Breast Fed  LATCH Score Latch: Grasps breast easily, tongue down, lips flanged, rhythmical sucking.  Audible Swallowing: None  Type of Nipple: Everted at rest and after stimulation  Comfort (Breast/Nipple): Soft / non-tender  Hold (Positioning): Assistance needed to correctly position infant at breast and maintain latch.  LATCH Score: 7  Interventions Interventions: Breast  feeding basics reviewed;Support pillows;Position options;Assisted with latch;Skin to skin;Breast massage;Hand express  Lactation Tools Discussed/Used WIC Program: No(Mom is interested in applying, lives in [place].)   Consult Status Consult Status: Follow-up Date: 04/28/18    Danelle Earthly 04/27/2018, 9:32 PM

## 2018-04-27 NOTE — Anesthesia Procedure Notes (Signed)
Epidural Patient location during procedure: OB Start time: 04/27/2018 1:34 AM End time: 04/27/2018 1:48 AM  Staffing Anesthesiologist: Trevor Iha, MD Performed: anesthesiologist   Preanesthetic Checklist Completed: patient identified, site marked, surgical consent, pre-op evaluation, timeout performed, IV checked, risks and benefits discussed and monitors and equipment checked  Epidural Patient position: sitting Prep: site prepped and draped and DuraPrep Patient monitoring: continuous pulse ox and blood pressure Approach: midline Location: L3-L4 Injection technique: LOR air  Needle:  Needle type: Tuohy  Needle gauge: 17 G Needle length: 9 cm and 9 Needle insertion depth: 7 cm Catheter type: closed end flexible Catheter size: 19 Gauge Catheter at skin depth: 12 cm Test dose: negative  Assessment Events: blood not aspirated, injection not painful, no injection resistance, negative IV test and no paresthesia  Additional Notes 1 attempt . Pt tolerated procedure well

## 2018-04-27 NOTE — Anesthesia Preprocedure Evaluation (Addendum)
Anesthesia Evaluation  Patient identified by MRN, date of birth, ID band Patient awake    Reviewed: Allergy & Precautions, NPO status , Patient's Chart, lab work & pertinent test results  Airway Mallampati: II  TM Distance: >3 FB Neck ROM: Full    Dental no notable dental hx. (+) Teeth Intact   Pulmonary neg pulmonary ROS,    Pulmonary exam normal breath sounds clear to auscultation       Cardiovascular Exercise Tolerance: Good negative cardio ROS Normal cardiovascular exam Rhythm:Regular Rate:Normal     Neuro/Psych negative neurological ROS  negative psych ROS   GI/Hepatic   Endo/Other    Renal/GU      Musculoskeletal   Abdominal   Peds  Hematology  (+) anemia ,   Anesthesia Other Findings   Reproductive/Obstetrics (+) Pregnancy                             Lab Results  Component Value Date   WBC 6.4 04/26/2018   HGB 12.6 04/26/2018   HCT 38.3 04/26/2018   MCV 88.2 04/26/2018   PLT 208 04/26/2018    Anesthesia Physical Anesthesia Plan  ASA: II  Anesthesia Plan: Epidural   Post-op Pain Management:    Induction:   PONV Risk Score and Plan:   Airway Management Planned:   Additional Equipment:   Intra-op Plan:   Post-operative Plan:   Informed Consent: I have reviewed the patients History and Physical, chart, labs and discussed the procedure including the risks, benefits and alternatives for the proposed anesthesia with the patient or authorized representative who has indicated his/her understanding and acceptance.     Plan Discussed with:   Anesthesia Plan Comments:         Anesthesia Quick Evaluation

## 2018-04-27 NOTE — Progress Notes (Signed)
Patient ID: Tamara Kelley, female   DOB: [birth year], 22 y.o.   MRN: [id number] Patient had a fetal heart rate decel x 3 min Turned side to side and Pitocin turned off Good recovery after that  FSE inserted  Will start amnioinfusion Will restart Pitocin after 30 minutes

## 2018-04-27 NOTE — Anesthesia Postprocedure Evaluation (Signed)
Anesthesia Post Note  Patient: [name]  Procedure(s) Performed: AN AD HOC LABOR EPIDURAL     Patient location during evaluation: Mother Baby Anesthesia Type: Epidural Level of consciousness: awake and alert Pain management: pain level controlled Vital Signs Assessment: post-procedure vital signs reviewed and stable Respiratory status: spontaneous breathing, nonlabored ventilation and respiratory function stable Cardiovascular status: stable Postop Assessment: no headache, no backache, epidural receding, able to ambulate, adequate PO intake, no apparent nausea or vomiting and patient able to bend at knees Anesthetic complications: no    Last Vitals:  Vitals:   04/27/18 1430 04/27/18 1544  BP: 133/77 131/85  Pulse: 61 67  Resp: 20 20  Temp: 36.9 C 36.6 C  SpO2:      Last Pain:  Vitals:   04/27/18 1735  TempSrc:   PainSc: 3    Pain Goal:                 [name]

## 2018-04-28 LAB — CBC
HCT: 30.1 % — ABNORMAL LOW (ref 36.0–46.0)
HEMOGLOBIN: 10.2 g/dL — AB (ref 12.0–15.0)
MCH: 29.7 pg (ref 26.0–34.0)
MCHC: 33.9 g/dL (ref 30.0–36.0)
MCV: 87.5 fL (ref 78.0–100.0)
PLATELETS: 177 10*3/uL (ref 150–400)
RBC: 3.44 MIL/uL — ABNORMAL LOW (ref 3.87–5.11)
RDW: 17.7 % — ABNORMAL HIGH (ref 11.5–15.5)
WBC: 11.5 10*3/uL — AB (ref 4.0–10.5)

## 2018-04-28 NOTE — Progress Notes (Signed)
Post Partum Day 1 Subjective: no complaints, up ad lib, voiding and tolerating PO  Objective: Blood pressure 107/60, pulse 76, temperature 97.7 F (36.5 C), temperature source Oral, resp. rate 14, height 5\' 2"  (1.575 m), weight 82.5 kg, last menstrual period 07/13/2017, SpO2 100 %, unknown if currently breastfeeding.  Physical Exam:  General: alert, cooperative and no distress Lochia: appropriate Uterine Fundus: firm Incision: perineum healing DVT Evaluation: No evidence of DVT seen on physical exam.  Recent Labs    04/26/18 1429 04/28/18 0511  HGB 12.6 10.2*  HCT 38.3 30.1*    Assessment/Plan: Plan for discharge tomorrow and Breastfeeding   LOS: 2 days   Wynelle Bourgeois 04/28/2018, 6:42 AM

## 2018-04-28 NOTE — Plan of Care (Signed)
Progressing appropriately.

## 2018-04-28 NOTE — Lactation Note (Signed)
This note was copied from a baby's chart. Lactation Consultation Note  Patient Name: Tamara Kelley Date: 04/28/2018 Reason for consult: Follow-up assessment Mom reports she feels like she has no milk. [name] Tamara Kelley now 3 hours old. Mom reports she tried to pump once and did not really like it and did not get anything.  Offered to assist with feeding.  Mom reports just ate. Offered to assist with hand expression. Mom reports she has been trying to hand express and unable to get anything.  Observe  Mom hand express.  Unable to express anything.  LC attempt to h/e.  Unable to see any glistenings with hand expression.  Discussed with mom to start pumping past a breastfeed somewhere around every 2-3 hours. Infant has not had any wets and poops since he turned 24 hours mom reports. PM LC to follow up with mom.     Maternal Data    Feeding Feeding Type: Breast Fed Length of feed: 20 min  LATCH Score                   Interventions Interventions: Breast feeding basics reviewed;Hand express;Breast massage;DEBP  Lactation Tools Discussed/Used     Consult Status Consult Status: Follow-up Date: 04/28/18 Follow-up type: In-patient    [name] Michaelle Copas 04/28/2018, 6:16 PM

## 2018-04-29 NOTE — Discharge Summary (Addendum)
OB Discharge Summary     Patient Name: Tamara Kelley DOB: 1996-06-20 MRN: [id number]  Date of admission: 04/26/2018 Delivering MD: Dorathy Kinsman   Date of discharge: 04/29/2018  Admitting diagnosis: induction Intrauterine pregnancy: [redacted]w[redacted]d     Secondary diagnosis:  Active Problems:   Post term pregnancy over 40 weeks   SVD (spontaneous vaginal delivery)  Additional problems: none     Discharge diagnosis: Term Pregnancy Delivered                                                                                                Post partum procedures:none  Augmentation: none  Complications: None  Hospital course:  Onset of Labor With Vaginal Delivery     22 y.o. yo G1P1001 at [redacted]w[redacted]d was admitted in Active Labor on 04/26/2018. Patient had an uncomplicated labor course as follows:  Membrane Rupture Time/Date: 11:47 PM ,04/26/2018   Intrapartum Procedures: Episiotomy: None [1]                                         Lacerations:  2nd degree [3];Perineal [11]  Patient had a delivery of a Viable infant. 04/27/2018  Information for the patient's newborn:  Abirami, Duryee [[id number]]  Delivery Method: Vaginal, Spontaneous(Filed from Delivery Summary)    Pateint had an uncomplicated postpartum course.  She is ambulating, tolerating a regular diet, passing flatus, and urinating well. Patient is discharged home in stable condition on 04/29/18.   Physical exam  Vitals:   04/28/18 0455 04/28/18 1230 04/28/18 2121 04/29/18 0541  BP: 107/60 (!) 116/58 113/78 116/89  Pulse: 76 68 78 72  Resp: 14 20 18 18   Temp: 97.7 F (36.5 C) 98.4 F (36.9 C) 98.3 F (36.8 C) 98.1 F (36.7 C)  TempSrc: Oral Oral Oral Oral  SpO2:   98%   Weight:      Height:       General: alert, cooperative and no distress Lochia: appropriate Uterine Fundus: firm Incision: N/A DVT Evaluation: No evidence of DVT seen on physical exam. Labs: Lab Results  Component Value Date   WBC 11.5 (H) 04/28/2018   HGB  10.2 (L) 04/28/2018   HCT 30.1 (L) 04/28/2018   MCV 87.5 04/28/2018   PLT 177 04/28/2018   No flowsheet data found.  Discharge instruction: per After Visit Summary and "Baby and Me Booklet".  After visit meds:  Allergies as of 04/29/2018   No Known Allergies     Medication List    STOP taking these medications   FUSION PLUS Caps     TAKE these medications   omeprazole 20 MG capsule Commonly known as:  PRILOSEC Take 1 capsule (20 mg total) by mouth 2 (two) times daily before a meal. What changed:    when to take this  reasons to take this   PRENATE PIXIE 10-0.6-0.4-200 MG Caps Take 1 tablet by mouth daily.       Diet: routine diet  Activity: Advance as tolerated. Pelvic rest for 6  weeks.   Outpatient follow up:4 weeks Follow up Appt: Future Appointments  Date Time Provider Department Center  05/26/2018  1:00 PM Constant, Peggy, MD [facility] None   Follow up Visit:No follow-ups on file.  Postpartum contraception: Nexplanon  Newborn Data: Live born female  Birth Weight: 7 lb 15.5 oz (3615 g) APGAR: 6, 9  Newborn Delivery   Birth date/time:  04/27/2018 11:54:00 Delivery type:  Vaginal, Spontaneous     Baby Feeding: Breast Disposition:home with mother   04/29/2018 Mirian Mo, MD   OB FELLOW DISCHARGE ATTESTATION  I have seen and examined this patient and agree with above documentation in the resident's note.   Marcy Siren, D.O. OB Fellow  04/29/2018, 10:09 AM

## 2018-04-29 NOTE — Lactation Note (Signed)
This note was copied from a baby's chart. Lactation Consultation Note  Patient Name: Tamara Kelley Date: 04/29/2018 Reason for consult: Follow-up assessment Per mom, she had BF 20 mins. prior LC entering the room. Latch was not observed by LC.  Mom feels infant is latching well. Had stool at 7:45 pm today. LC ask mom hand express a small amount of colostrum present today whereas there was none present yesterday. Mom did not use DEBP as advised for breast stimulation and induction she only pumped twice. Infant w/ mom consent was given 10 ml of formula w/ glove on curve tip syringe. Mom will start pumping every 3 hours as advised by LC. Mom will continue w/ breastfeeding infant according hunger cues, 8 to 12 times per day including nights.     Maternal Data    Feeding Feeding Type: Breast Fed Length of feed: 15 min  LATCH Score Latch: Grasps breast easily, tongue down, lips flanged, rhythmical sucking.  Audible Swallowing: Spontaneous and intermittent  Type of Nipple: Everted at rest and after stimulation  Comfort (Breast/Nipple): Filling, red/small blisters or bruises, mild/mod discomfort  Hold (Positioning): Assistance needed to correctly position infant at breast and maintain latch.  LATCH Score: 8  Interventions Interventions: Breast feeding basics reviewed;Breast massage;DEBP  Lactation Tools Discussed/Used     Consult Status      Danelle Earthly 04/29/2018, 12:53 AM

## 2018-04-29 NOTE — Lactation Note (Signed)
This note was copied from a baby's chart. Lactation Consultation Note  Patient Name: Boy Lycia Sachdeva [id number] Date: 04/29/2018 Reason for consult: Follow-up assessment;Infant weight loss;Nipple pain/trauma;Other (Comment)(7% weight loss , post dates / baby for circ )  Baby is 46 hours old/ at 36 hours Bili 9.2.  Per  Mom the last feeding after breast feeding the baby wasn't satisfied so I ask  For formula and he only took 5 ml.  Per mom nipples are tender,LC offered to assess breast tissue .  No breakdown noted either nipple/ LC had mom compress areolas and  Noted areola edema.  LC instructed mom on the use breast shells until the swelling and tender improve between feedings  Except when sleeping. Also to use EBM to nipples liberally. Has coconut oil , use after EBM.  LC showed mom how to use the hand pump from the DEBP manually.  Mother informed of post-discharge support and given phone number to the lactation department, including services for phone call assistance; out-patient appointments; and breastfeeding support group. List of other breastfeeding resources in the community given in the handout. Encouraged mother to call for problems or concerns related to breastfeeding.   Maternal Data Has patient been taught Hand Expression?: Yes(LC reviewed and mom able to return demo )  Feeding Feeding Type: Breast Milk with Formula added Length of feed: 20 min(per mom )  LATCH Score                   Interventions Interventions: Breast feeding basics reviewed  Lactation Tools Discussed/Used Tools: Shells;Pump Shell Type: Inverted Breast pump type: Double-Electric Breast Pump;Manual(showed mom how to use the hand pump from the kit ) Pump Review: Milk Storage   Consult Status Consult Status: Complete Date: 04/29/18    Jerlyn Ly Hakan Nudelman 04/29/2018, 10:12 AM

## 2018-04-29 NOTE — Discharge Instructions (Signed)
Vaginal Delivery, Care After °Refer to this sheet in the next few weeks. These instructions provide you with information about caring for yourself after vaginal delivery. Your health care provider may also give you more specific instructions. Your treatment has been planned according to current medical practices, but problems sometimes occur. Call your health care provider if you have any problems or questions. °What can I expect after the procedure? °After vaginal delivery, it is common to have: °· Some bleeding from your vagina. °· Soreness in your abdomen, your vagina, and the area of skin between your vaginal opening and your anus (perineum). °· Pelvic cramps. °· Fatigue. ° °Follow these instructions at home: °Medicines °· Take over-the-counter and prescription medicines only as told by your health care provider. °· If you were prescribed an antibiotic medicine, take it as told by your health care provider. Do not stop taking the antibiotic until it is finished. °Driving ° °· Do not drive or operate heavy machinery while taking prescription pain medicine. °· Do not drive for 24 hours if you received a sedative. °Lifestyle °· Do not drink alcohol. This is especially important if you are breastfeeding or taking medicine to relieve pain. °· Do not use tobacco products, including cigarettes, chewing tobacco, or e-cigarettes. If you need help quitting, ask your health care provider. °Eating and drinking °· Drink at least 8 eight-ounce glasses of water every day unless you are told not to by your health care provider. If you choose to breastfeed your baby, you may need to drink more water than this. °· Eat high-fiber foods every day. These foods may help prevent or relieve constipation. High-fiber foods include: °? Whole grain cereals and breads. °? Brown rice. °? Beans. °? Fresh fruits and vegetables. °Activity °· Return to your normal activities as told by your health care provider. Ask your health care provider  what activities are safe for you. °· Rest as much as possible. Try to rest or take a nap when your baby is sleeping. °· Do not lift anything that is heavier than your baby or 10 lb (4.5 kg) until your health care provider says that it is safe. °· Talk with your health care provider about when you can engage in sexual activity. This may depend on your: °? Risk of infection. °? Rate of healing. °? Comfort and desire to engage in sexual activity. °Vaginal Care °· If you have an episiotomy or a vaginal tear, check the area every day for signs of infection. Check for: °? More redness, swelling, or pain. °? More fluid or blood. °? Warmth. °? Pus or a bad smell. °· Do not use tampons or douches until your health care provider says this is safe. °· Watch for any blood clots that may pass from your vagina. These may look like clumps of dark red, brown, or black discharge. °General instructions °· Keep your perineum clean and dry as told by your health care provider. °· Wear loose, comfortable clothing. °· Wipe from front to back when you use the toilet. °· Ask your health care provider if you can shower or take a bath. If you had an episiotomy or a perineal tear during labor and delivery, your health care provider may tell you not to take baths for a certain length of time. °· Wear a bra that supports your breasts and fits you well. °· If possible, have someone help you with household activities and help care for your baby for at least a few days after   you leave the hospital. °· Keep all follow-up visits for you and your baby as told by your health care provider. This is important. °Contact a health care provider if: °· You have: °? Vaginal discharge that has a bad smell. °? Difficulty urinating. °? Pain when urinating. °? A sudden increase or decrease in the frequency of your bowel movements. °? More redness, swelling, or pain around your episiotomy or vaginal tear. °? More fluid or blood coming from your episiotomy or  vaginal tear. °? Pus or a bad smell coming from your episiotomy or vaginal tear. °? A fever. °? A rash. °? Little or no interest in activities you used to enjoy. °? Questions about caring for yourself or your baby. °· Your episiotomy or vaginal tear feels warm to the touch. °· Your episiotomy or vaginal tear is separating or does not appear to be healing. °· Your breasts are painful, hard, or turn red. °· You feel unusually sad or worried. °· You feel nauseous or you vomit. °· You pass large blood clots from your vagina. If you pass a blood clot from your vagina, save it to show to your health care provider. Do not flush blood clots down the toilet without having your health care provider look at them. °· You urinate more than usual. °· You are dizzy or light-headed. °· You have not breastfed at all and you have not had a menstrual period for 12 weeks after delivery. °· You have stopped breastfeeding and you have not had a menstrual period for 12 weeks after you stopped breastfeeding. °Get help right away if: °· You have: °? Pain that does not go away or does not get better with medicine. °? Chest pain. °? Difficulty breathing. °? Blurred vision or spots in your vision. °? Thoughts about hurting yourself or your baby. °· You develop pain in your abdomen or in one of your legs. °· You develop a severe headache. °· You faint. °· You bleed from your vagina so much that you fill two sanitary pads in one hour. °This information is not intended to replace advice given to you by your health care provider. Make sure you discuss any questions you have with your health care provider. °Document Released: 08/09/2000 Document Revised: 01/24/2016 Document Reviewed: 08/27/2015 °Elsevier Interactive Patient Education © 2018 [employer] ° °

## 2018-04-29 NOTE — Clinical Social Work Maternal (Signed)
CLINICAL SOCIAL WORK MATERNAL/CHILD NOTE  Patient Details  Name: Tamara Kelley MRN: [id number] Date of Birth: 04/22/1996  Date:  09-Jul-2018  Clinical Social Worker Initiating Note:  Terri Piedra, [place] Date/Time: Initiated:  04/29/18/0902     Child's Name:  Alean Rinne   Biological Parents:  Mother, Father(Kamylah Mayer Masker and Kavan Devan)   Need for Interpreter:  None   Reason for Referral:  Current Substance Use/Substance Use During Pregnancy (Hx of Marijuana use)   Address:  [place] [address]    Phone number:  [phone] (home)     Additional phone number:   Household Members/Support Persons (HM/SP):   Household Member/Support Person 1   HM/SP Name Relationship DOB or Age  HM/SP -[place] FOB/boyfriend 09/23/88  HM/SP -2        HM/SP -3        HM/SP -4        HM/SP -5        HM/SP -6        HM/SP -7        HM/SP -8          Natural Supports (not living in the home):  Parent, Extended Family, Immediate Family, Friends   Chiropodist: None   Employment:     Type of Work:     Education:      Homebound arranged:    Museum/gallery curator Resources:  Medicaid, Multimedia programmer   Other Resources:      Cultural/Religious Considerations Which May Impact Care: None stated  Strengths:  Ability to meet basic needs , Engineer, materials, Home prepared for child    Psychotropic Medications:         Pediatrician:    [place] area  Pediatrician List:   [facility]  [place]      Pediatrician Fax Number:    Risk Factors/Current Problems:  None   Cognitive State:  Alert , Insightful , Linear Thinking , Goal Oriented , Able to Concentrate    Mood/Affect:  Euthymic , Calm , Interested    CSW Assessment: CSW met with MOB in her first floor room/136 to offer support and complete assessment due to hx of  marijuana use.  MOB was calm, pleasant and easy to engage.  She smiled when she spoke about her baby and stated that he is doing well.  She reports having an easy pregnancy and positive labor and delivery experience.  She states she has a great support system and lives with FOB.  This is his first child as well.   MOB reports no emotional concerns at this time and states no hx of mental illness.  She was engaged and attentive to information given by CSW regarding PMADs and SIDS precautions.   MOB was understanding of hospital drug screen policy and reports that she smoked marijuana occasionally prior to pregnancy and stopped completely at approximately [redacted] weeks gestational when she had a positive pregnancy test.  She denies any other substance use.  She was not screened in pregnancy and baby's UDS and CDS are pending.  CSW Plan/Description:  No Further Intervention Required/No Barriers to Discharge, Sudden Infant Death Syndrome (SIDS) Education, CSW Will Continue to Monitor Umbilical Cord Tissue Drug Screen Results and Make Report if [facility], [place], Perinatal Mood and Anxiety Disorder (PMADs) Education    Alphonzo Cruise, [place]  04/29/2018, 9:04 AM

## 2018-05-26 ENCOUNTER — Ambulatory Visit: Payer: BLUE CROSS/BLUE SHIELD | Admitting: Obstetrics & Gynecology

## 2018-05-27 ENCOUNTER — Ambulatory Visit: Payer: BLUE CROSS/BLUE SHIELD | Admitting: Certified Nurse Midwife

## 2018-06-02 ENCOUNTER — Ambulatory Visit: Payer: BLUE CROSS/BLUE SHIELD | Admitting: Obstetrics and Gynecology

## 2018-06-03 ENCOUNTER — Ambulatory Visit (INDEPENDENT_AMBULATORY_CARE_PROVIDER_SITE_OTHER): Payer: BLUE CROSS/BLUE SHIELD | Admitting: Internal Medicine

## 2018-06-03 DIAGNOSIS — Z975 Presence of (intrauterine) contraceptive device: Secondary | ICD-10-CM | POA: Insufficient documentation

## 2018-06-03 DIAGNOSIS — Z30017 Encounter for initial prescription of implantable subdermal contraceptive: Secondary | ICD-10-CM

## 2018-06-03 MED ORDER — ETONOGESTREL 68 MG ~~LOC~~ IMPL
68.0000 mg | DRUG_IMPLANT | Freq: Once | SUBCUTANEOUS | Status: AC
  Administered 2018-06-03: 68 mg via SUBCUTANEOUS

## 2018-06-03 NOTE — Progress Notes (Signed)
Post Partum Exam  Tamara Kelley is a 22 y.o. G46P1001 female who presents for a postpartum visit. She is 5 weeks postpartum following a spontaneous vaginal delivery. I have fully reviewed the prenatal and intrapartum course. The delivery was at 41 gestational weeks.  Anesthesia: epidural. Postpartum course has been doing well. Baby's course has been doing well. Baby is feeding by breast with bottle feeds. Bleeding thin lochia. Bowel function is normal. Bladder function is normal. Patient is not sexually active. Contraception method is abstinence.  Postpartum depression screening:neg, score 0.  The following portions of the patient's history were reviewed and updated as appropriate: allergies, current medications, past family history, past medical history, past social history, past surgical history and problem list. Last pap smear done 09/2017 and was Normal  Review of Systems Pertinent items are noted in HPI.    Objective:  Blood pressure 121/83, pulse 69, weight 156 lb (70.8 kg), unknown if currently breastfeeding.  General:  alert and cooperative  Lungs: clear to auscultation bilaterally  Heart:  regular rate and rhythm, S1, S2 normal, no murmur, click, rub or gallop  Abdomen: soft, non-tender; bowel sounds normal; no masses,  no organomegaly        PROCEDURE NOTE: NEXPLANON  INSERTION Patient given informed consent and signed copy in the chart.  Pregnancy test was not performed due to abstinence after delivery.  Appropriate time out was taken. Left  arm was prepped and draped in the usual sterile fashion. Appropriate measurement was made for insertion of nexplanon and landmarks identified, insertion site marked. Three cc of 1%lidocaine  was used for local anesthesia. Once anesthesia obtained, nexplanon was inserted in typical fashion. No complications. Pressure bandage applied to decrease bruising. Patient given follow up instructions should she experience redness, swelling at sight or  fever in the next 24 hours. Patient given Nexplanon pocket card.   Assessment:    Benign postpartum exam. Pap smear not done at today's visit as screening was normal within the past year.   Plan:   1. Contraception: Nexplanon placed today  2. Follow up in: 1 year for annual exam or as needed.

## 2018-06-03 NOTE — Patient Instructions (Signed)

## 2019-09-07 IMAGING — US US MFM OB COMP +14 WKS
1 series · 14 of 28 positions shown · non-contrast
Comparison: none

[Series 1: us mfm ob comp +14 wks · 14 of 97 slices shown]
[im 4/97]
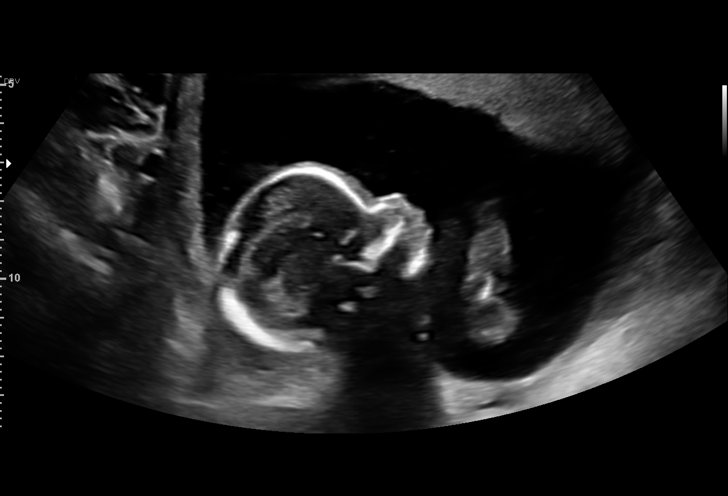
[im 11/97]
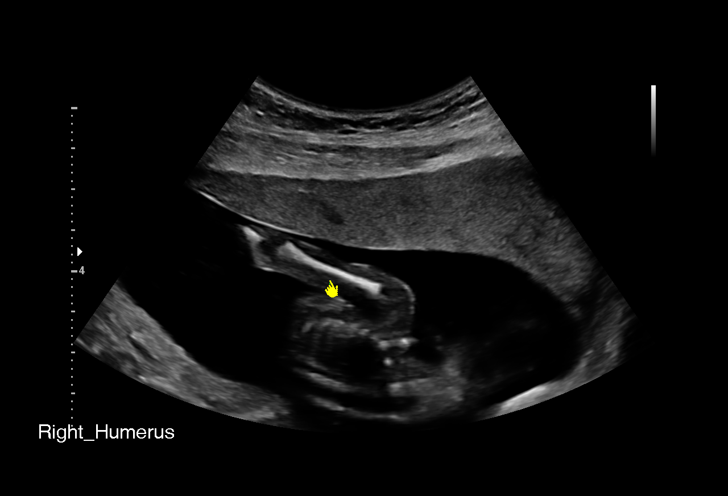
[im 18/97]
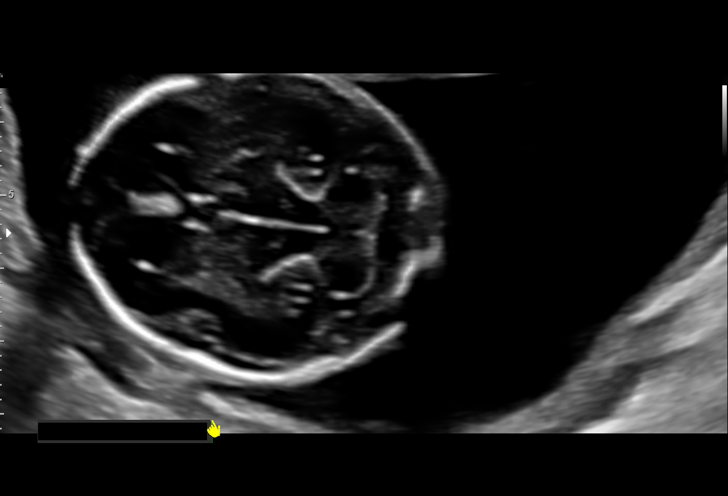
[im 25/97]
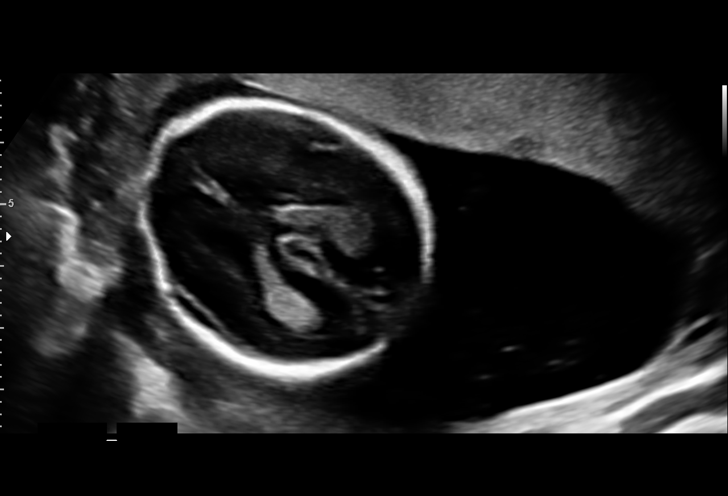
[im 33/97]
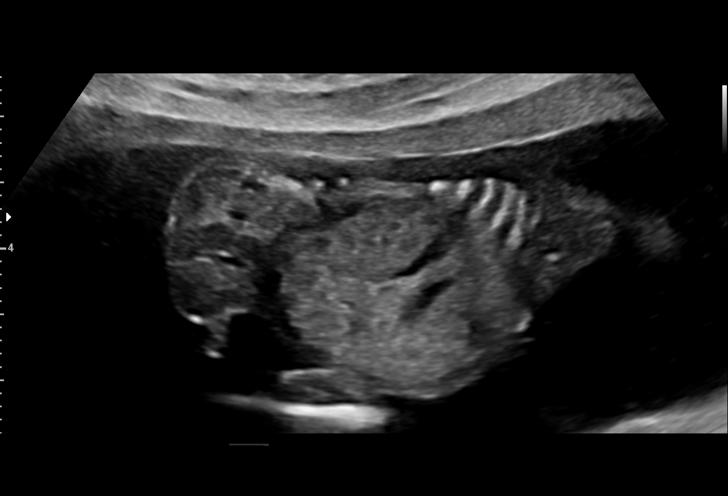
[im 40/97]
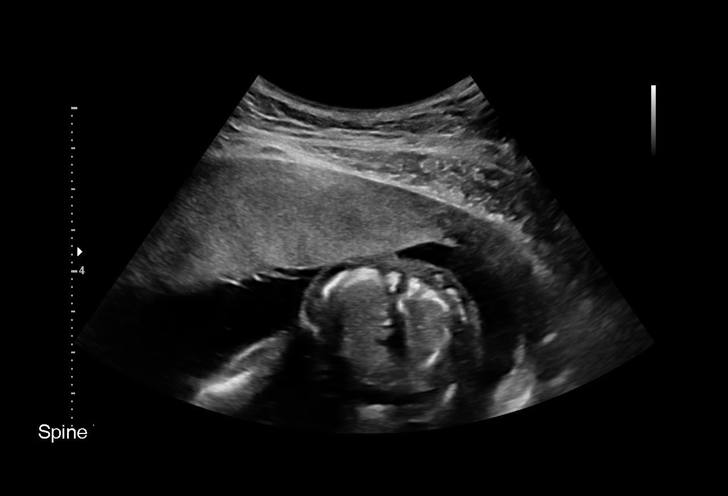
[im 47/97]
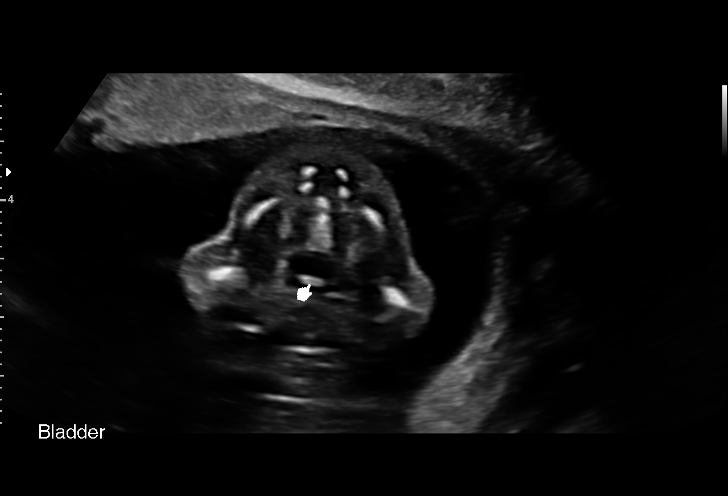
[im 54/97]
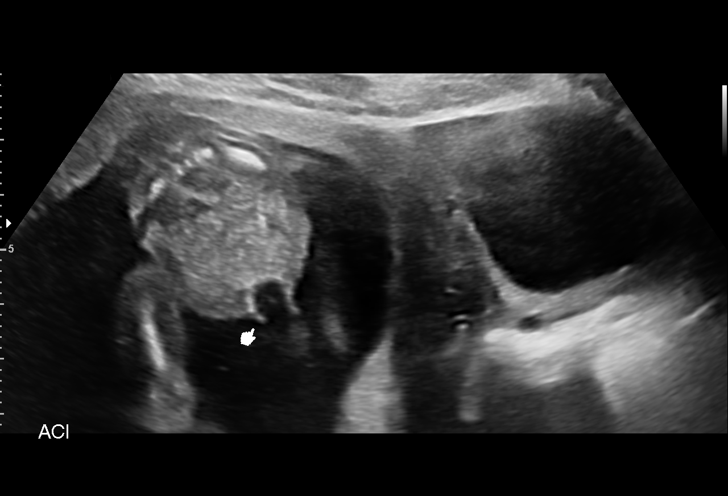
[im 61/97]
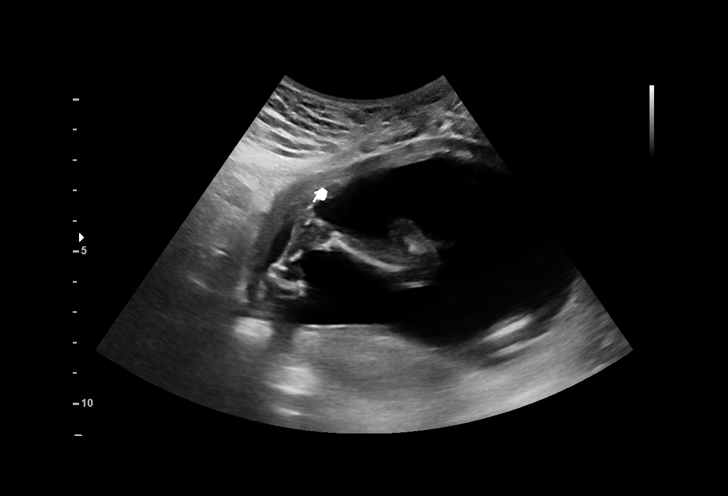
[im 68/97]
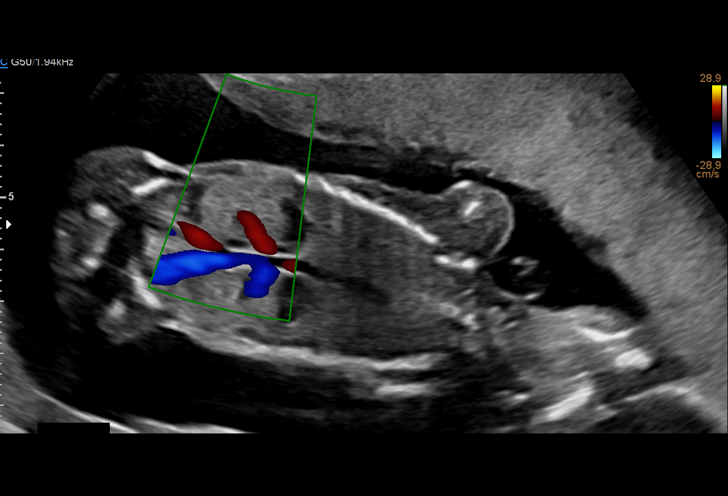
[im 75/97]
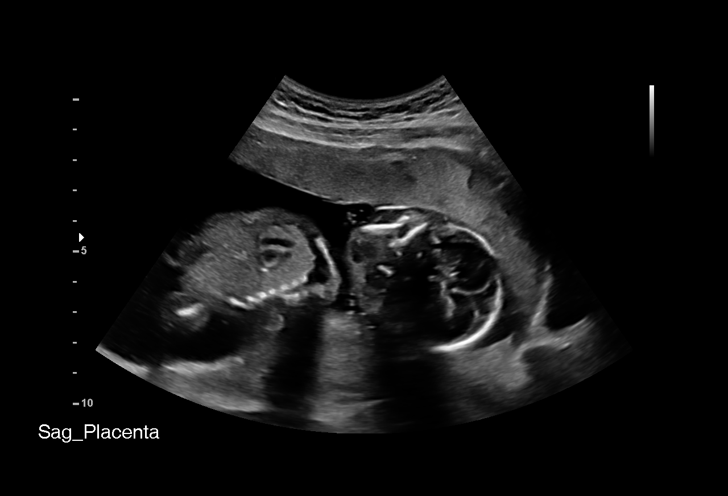
[im 82/97]
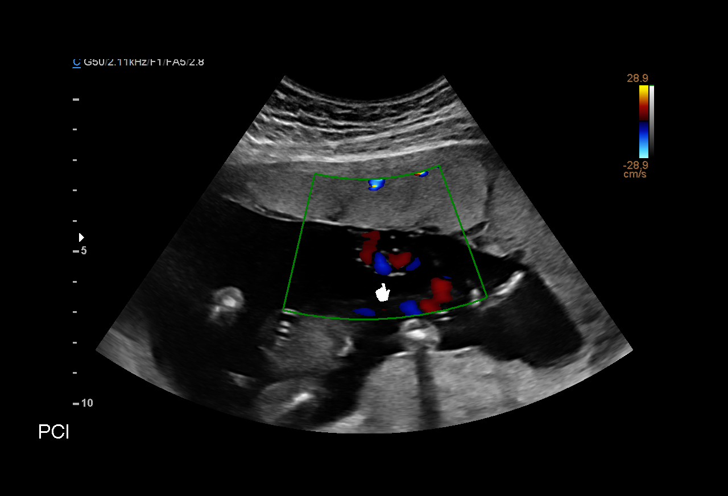
[im 89/97]
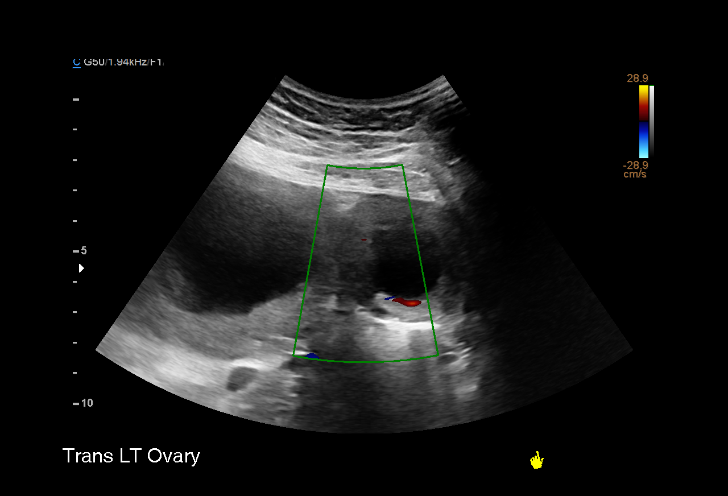
[im 97/97]
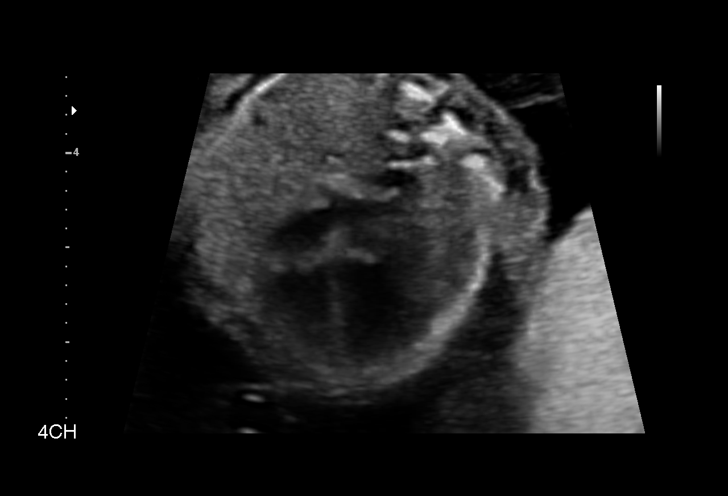

[14 of 28 positions shown; findings below may reference images not displayed]

Road [HOSPITAL]

Indications

19 weeks gestation of pregnancy
Encounter for fetal anatomic survey
OB History

Blood Type:            Height:  5'2"   Weight (lb):  158       BMI:
Gravidity:    1
Fetal Evaluation

Num Of Fetuses:     1
Fetal Heart         153
Rate(bpm):
Cardiac Activity:   Observed
Presentation:       Cephalic
Placenta:           Anterior, above cervical os
P. Cord Insertion:  Visualized

Amniotic Fluid
AFI FV:      Subjectively within normal limits

Largest Pocket(cm)
4.5
Biometry

BPD:      41.9  mm     G. Age:  18w 5d         31  %    CI:         78.3   %    70 - 86
FL/HC:      19.6   %    16.1 -
HC:      149.8  mm     G. Age:  18w 1d          6  %    HC/AC:      1.13        1.09 -
AC:       132   mm     G. Age:  18w 5d         32  %    FL/BPD:     70.2   %
FL:       29.4  mm     G. Age:  19w 0d         40  %    FL/AC:      22.3   %    20 - 24
HUM:      27.5  mm     G. Age:  18w 6d         41  %
CER:      18.8  mm     G. Age:  18w 3d         29  %
NFT:       2.9  mm
CM:        4.3  mm

Est. FW:     257  gm      0 lb 9 oz     38  %
Gestational Age

LMP:           19w 1d        Date:  07/13/17                 EDD:   04/19/18
U/S Today:     18w 5d                                        EDD:   04/22/18
Best:          19w 1d     Det. By:  LMP  (07/13/17)          EDD:   04/19/18
Anatomy

Cranium:               Appears normal         Aortic Arch:            Appears normal
Cavum:                 Appears normal         Ductal Arch:            Not well visualized
Ventricles:            Appears normal         Diaphragm:              Appears normal
Choroid Plexus:        Appears normal         Stomach:                Appears normal, left
sided
Cerebellum:            Appears normal         Abdomen:                Appears normal
Posterior Fossa:       Appears normal         Abdominal Wall:         Appears nml (cord
insert, abd wall)
Nuchal Fold:           Appears normal         Cord Vessels:           Appears normal (3
vessel cord)
Face:                  Appears normal         Kidneys:                Appear normal
(orbits and profile)
Lips:                  Not well visualized    Bladder:                Appears normal
Thoracic:              Appears normal         Spine:                  Not well visualized
Heart:                 Appears normal         Upper Extremities:      Appears normal
(4CH, axis, and situs
RVOT:                  Not well visualized    Lower Extremities:      Appears normal
LVOT:                  Not well visualized

Other:  Fetus appears to be a male. Heels and 5th digit visualized. Open
hands visualized. Nasal bone visualized. Technically difficult due to
fetal position and movement.
Cervix Uterus Adnexa

Cervix
Length:           3.64  cm.
Normal appearance by transabdominal scan.

Uterus
No abnormality visualized.

Left Ovary
Size(cm)       8.3  x   5.4    x  4.6       Vol(ml): 108
Simple cyst measuring = 3.9x6.9x6.6cm

Right Ovary
Size(cm)       3.7  x   2.1    x  1.9       Vol(ml):
Within normal limits.
Impression

Singleton intrauterine pregnancy at 19+1 weeks here for
anatomic survey
Review of the anatomy shows no sonographic markers for
aneuploidy or structural anomalies
However, views of the face, heart and spine should be
considered suboptimal secondary to fetal position
Amniotic fluid volume is normal
Estimated fetal weight shows growth in the 38th percentile
Recommendations

Recommend follow-up ultrasound examination in 4 weeks for
completion of the anatomic survey

## 2019-10-14 IMAGING — US US MFM OB FOLLOW-UP
1 series · 14 of 28 positions shown · non-contrast
Comparison: none

[Series 1: us mfm ob follow-up · 48 acquisitions, 14 frames shown]
[im 2/48]
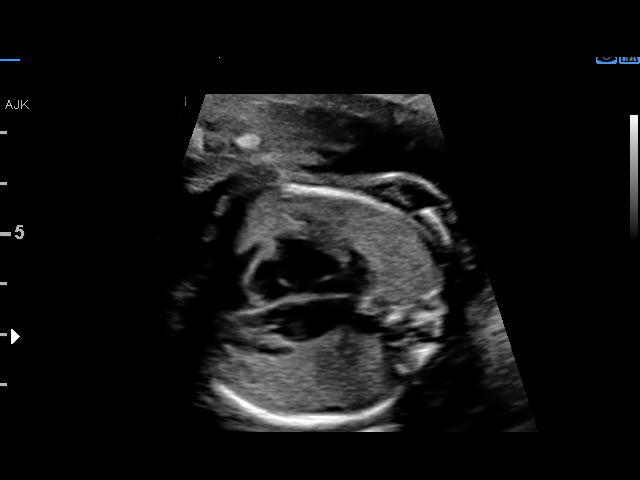
[im 6/48]
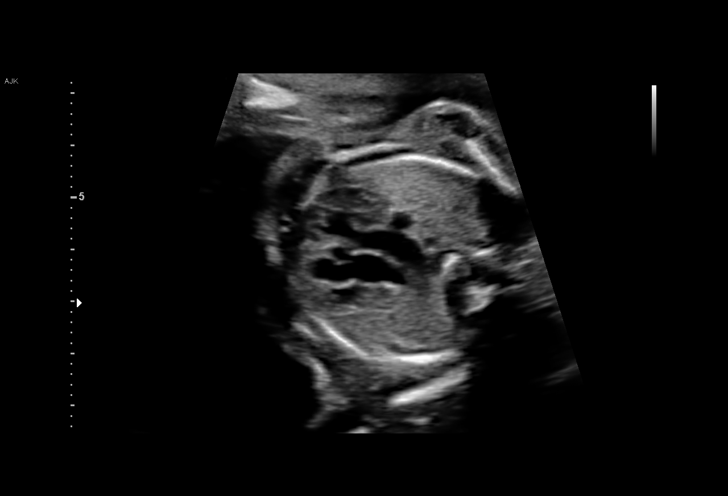
[im 9/48]
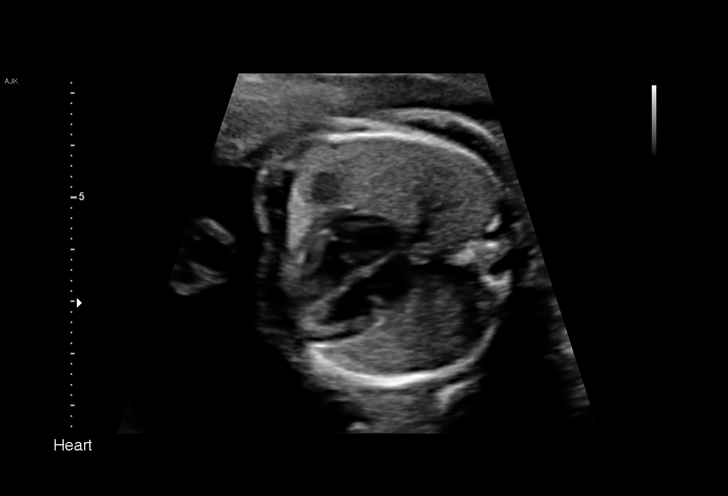
[im 13/48]
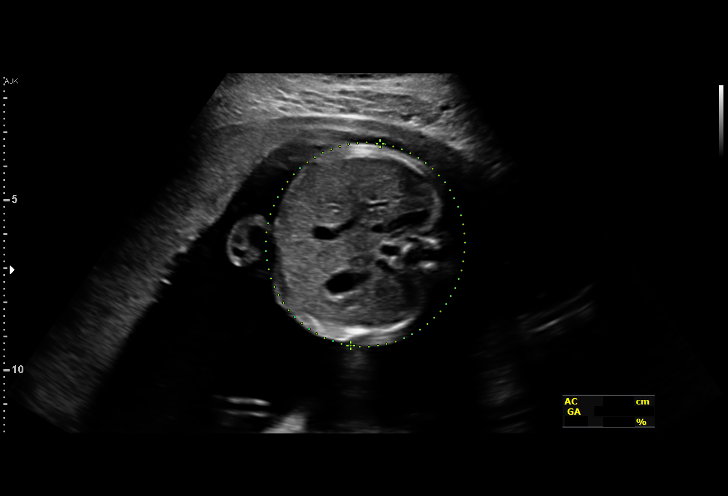
[im 16/48]
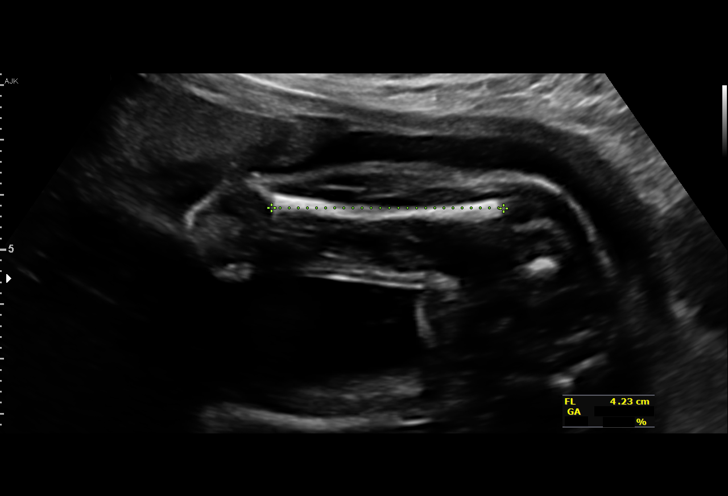
[im 20/48]
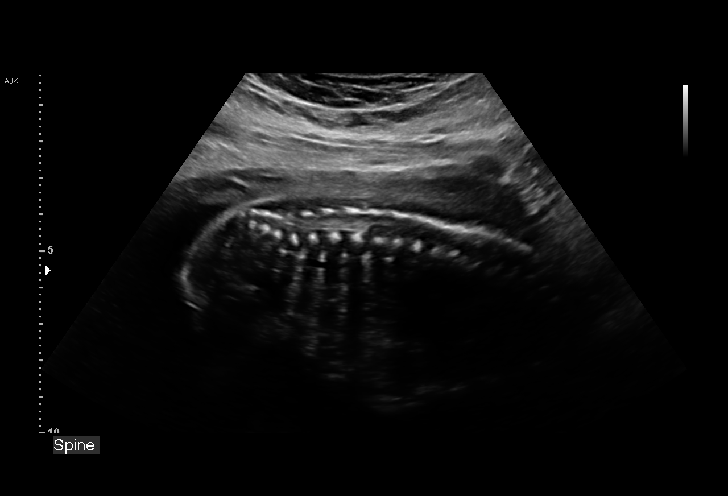
[im 23/48]
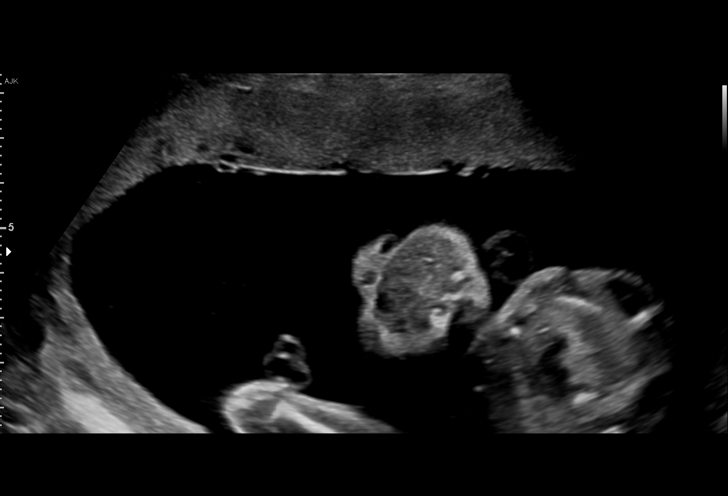
[im 27/48]
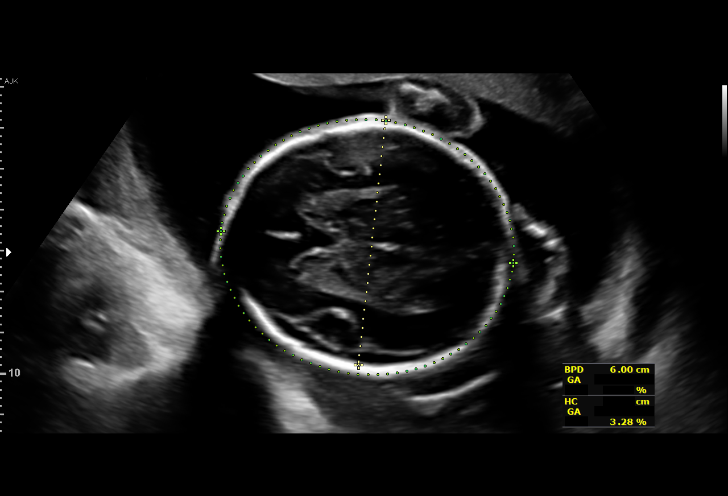
[im 30/48]
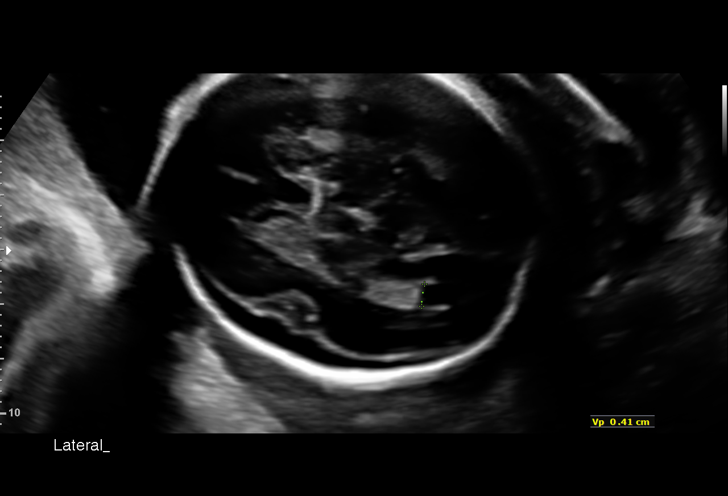
[im 34/48]
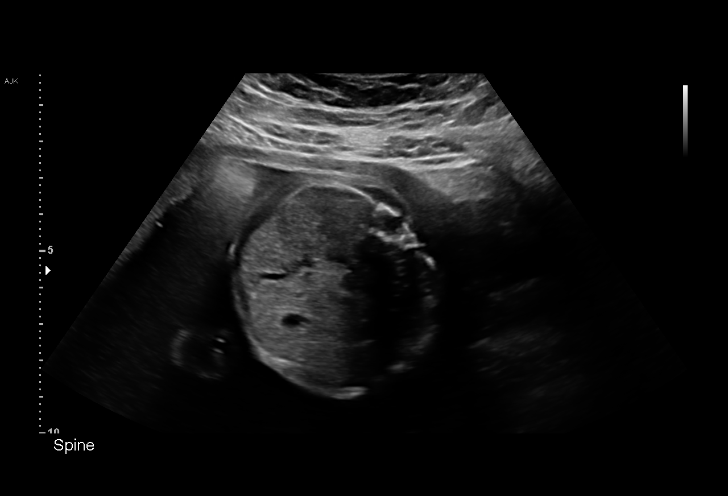
[im 37/48]
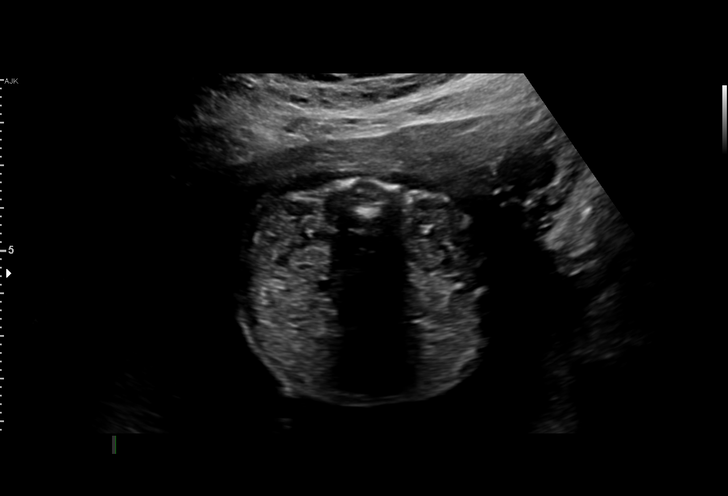
[im 41/48]
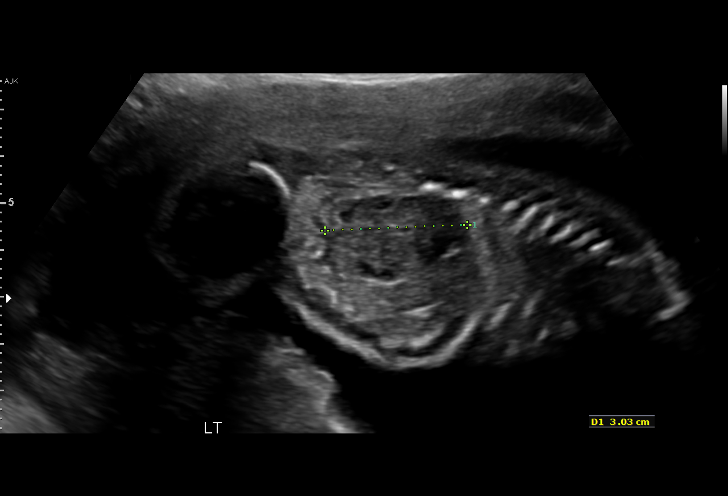
[im 44/48]
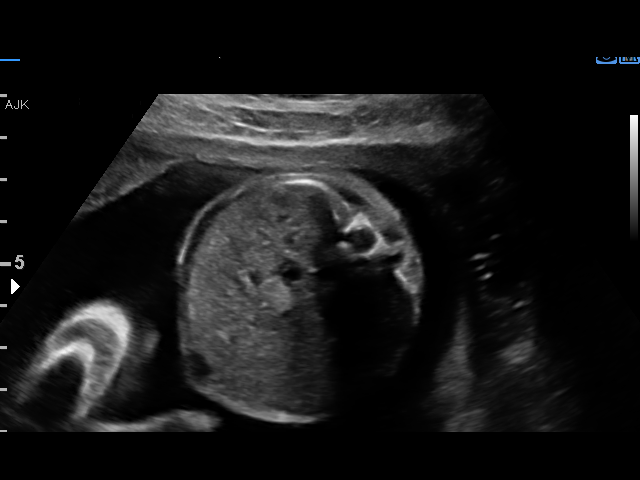
[im 48/48]
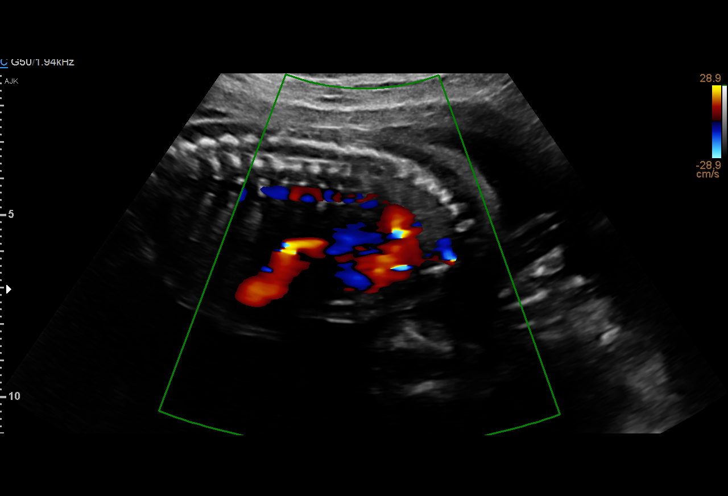

[14 of 28 positions shown; findings below may reference images not displayed]

Road [[email or web address]]

Indications

24 weeks gestation of pregnancy
Encounter for other antenatal screening
follow-up
OB History

Blood Type:            Height:  5'2"   Weight (lb):  158       BMI:
Gravidity:    1
Fetal Evaluation

Num Of Fetuses:     1
Fetal Heart         144
Rate(bpm):
Cardiac Activity:   Observed
Presentation:       Cephalic
Placenta:           Anterior, above cervical os
P. Cord Insertion:  Visualized

Amniotic Fluid
AFI FV:      Subjectively upper-normal

Largest Pocket(cm)
8.12
Biometry

BPD:      59.7  mm     G. Age:  24w 3d         40  %    CI:        78.85   %    70 - 86
FL/HC:      20.1   %    18.7 -
HC:      212.6  mm     G. Age:  23w 3d          6  %    HC/AC:      1.11        1.05 -
AC:      191.8  mm     G. Age:  23w 6d         25  %    FL/BPD:     71.7   %    71 - 87
FL:       42.8  mm     G. Age:  24w 0d         24  %    FL/AC:      22.3   %    20 - 24
Est. FW:     641  gm      1 lb 7 oz     40  %
Gestational Age

LMP:           24w 3d        Date:  07/13/17                 EDD:   04/19/18
U/S Today:     24w 0d                                        EDD:   04/22/18
Best:          24w 3d     Det. By:  LMP  (07/13/17)          EDD:   04/19/18
Anatomy

Cranium:               Appears normal         Aortic Arch:            Previously seen
Cavum:                 Appears normal         Ductal Arch:            Not well visualized
Ventricles:            Appears normal         Diaphragm:              Appears normal
Choroid Plexus:        Previously seen        Stomach:                Appears normal, left
sided
Cerebellum:            Previously seen        Abdomen:                Previously seen
Posterior Fossa:       Previously seen        Abdominal Wall:         Previously seen
Nuchal Fold:           Previously seen        Cord Vessels:           Previously seen
Face:                  Orbits and profile     Kidneys:                Appear normal
previously seen
Lips:                  Appears normal         Bladder:                Appears normal
Thoracic:              Appears normal         Spine:                  Appears normal
Heart:                 Appears normal         Upper Extremities:      Previously seen
(4CH, axis, and situs
RVOT:                  Appears normal         Lower Extremities:      Previously seen
LVOT:                  Appears normal

Other:  Fetus appears to be a male. Heels and 5th digit previously seen.
Nasal bone visualized. Technically difficult due to fetal position.
Cervix Uterus Adnexa

Cervix
Length:           3.19  cm.
Normal appearance by transabdominal scan.

Uterus
No abnormality visualized.

Left Ovary
Not visualized.

Right Ovary
Not visualized.

Adnexa:       No abnormality visualized.
Impression

Singleton intrauterine pregnancy at 24+3 weeks here for
completion of the anatoic survey
Review of the anatomy shows no sonographic markers for
aneuploidy or structural anomalies
However, views of the ductal arch should be considered
suboptimal secondary to fetal position
Amniotic fluid volume is normal
Estimated fetal weight shows growth in the 40th percentile
Recommendations

Recommend follow-up ultrasound examination in 4 weeks for
an attempt to complet views of the ductal arch
